# Patient Record
Sex: Female | Born: 2000 | Race: White | Hispanic: No | Marital: Single | Smoking: Never smoker
Health system: Southern US, Community
[De-identification: ages and names within clinical notes are randomized; demographics above are authoritative.]

## PROBLEM LIST (undated history)

## (undated) DIAGNOSIS — C801 Malignant (primary) neoplasm, unspecified: Secondary | ICD-10-CM

## (undated) DIAGNOSIS — R569 Unspecified convulsions: Secondary | ICD-10-CM

## (undated) DIAGNOSIS — F4320 Adjustment disorder, unspecified: Secondary | ICD-10-CM

## (undated) DIAGNOSIS — F445 Conversion disorder with seizures or convulsions: Secondary | ICD-10-CM

## (undated) DIAGNOSIS — F411 Generalized anxiety disorder: Secondary | ICD-10-CM

## (undated) HISTORY — PX: OTHER SURGICAL HISTORY: SHX169

---
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
## 2019-01-01 ENCOUNTER — Other Ambulatory Visit: Payer: Self-pay

## 2019-01-01 ENCOUNTER — Emergency Department (HOSPITAL_COMMUNITY)
Admission: EM | Admit: 2019-01-01 | Discharge: 2019-01-02 | Disposition: A | Discharge status: Home / Self Care | Payer: Medicaid Other | Attending: Emergency Medicine | Admitting: Emergency Medicine

## 2019-01-01 ENCOUNTER — Encounter (HOSPITAL_COMMUNITY): Payer: Self-pay

## 2019-01-01 DIAGNOSIS — Z79899 Other long term (current) drug therapy: Secondary | ICD-10-CM | POA: Diagnosis not present

## 2019-01-01 DIAGNOSIS — Z85841 Personal history of malignant neoplasm of brain: Secondary | ICD-10-CM | POA: Diagnosis not present

## 2019-01-01 DIAGNOSIS — R569 Unspecified convulsions: Secondary | ICD-10-CM | POA: Diagnosis present

## 2019-01-01 LAB — CBC WITH DIFFERENTIAL/PLATELET
Abs Immature Granulocytes: 0.02 10*3/uL (ref 0.00–0.07)
Basophils Absolute: 0.1 10*3/uL (ref 0.0–0.1)
Basophils Relative: 1 %
Eosinophils Absolute: 0.2 10*3/uL (ref 0.0–0.5)
Eosinophils Relative: 2 %
HCT: 46.9 % — ABNORMAL HIGH (ref 36.0–46.0)
Hemoglobin: 15.5 g/dL — ABNORMAL HIGH (ref 12.0–15.0)
Immature Granulocytes: 0 %
Lymphocytes Relative: 28 %
Lymphs Abs: 2 10*3/uL (ref 0.7–4.0)
MCH: 30.3 pg (ref 26.0–34.0)
MCHC: 33 g/dL (ref 30.0–36.0)
MCV: 91.8 fL (ref 80.0–100.0)
Monocytes Absolute: 0.7 10*3/uL (ref 0.1–1.0)
Monocytes Relative: 10 %
Neutro Abs: 4.1 10*3/uL (ref 1.7–7.7)
Neutrophils Relative %: 59 %
Platelets: 358 10*3/uL (ref 150–400)
RBC: 5.11 MIL/uL (ref 3.87–5.11)
RDW: 12.1 % (ref 11.5–15.5)
WBC: 7 10*3/uL (ref 4.0–10.5)
nRBC: 0 % (ref 0.0–0.2)

## 2019-01-01 LAB — CBG MONITORING, ED: Glucose-Capillary: 83 mg/dL (ref 70–99)

## 2019-01-01 LAB — COMPREHENSIVE METABOLIC PANEL
ALT: 16 U/L (ref 0–44)
AST: 19 U/L (ref 15–41)
Albumin: 4.3 g/dL (ref 3.5–5.0)
Alkaline Phosphatase: 74 U/L (ref 38–126)
Anion gap: 13 (ref 5–15)
BUN: 7 mg/dL (ref 6–20)
CO2: 23 mmol/L (ref 22–32)
Calcium: 9.7 mg/dL (ref 8.9–10.3)
Chloride: 102 mmol/L (ref 98–111)
Creatinine, Ser: 0.84 mg/dL (ref 0.44–1.00)
GFR calc Af Amer: >60 mL/min (ref >60)
GFR calc non Af Amer: >60 mL/min (ref >60)
Glucose, Bld: 94 mg/dL (ref 70–99)
Potassium: 3.8 mmol/L (ref 3.5–5.1)
Sodium: 138 mmol/L (ref 135–145)
Total Bilirubin: 0.5 mg/dL (ref 0.3–1.2)
Total Protein: 7.4 g/dL (ref 6.5–8.1)

## 2019-01-01 LAB — I-STAT BETA HCG BLOOD, ED (MC, WL, AP ONLY): I-stat hCG, quantitative: <5 m[IU]/mL (ref <5)

## 2019-01-01 LAB — MAGNESIUM: Magnesium: 2 mg/dL (ref 1.7–2.4)

## 2019-01-01 MED ORDER — SODIUM CHLORIDE 0.9 % IV BOLUS
500.0000 mL | Freq: Once | INTRAVENOUS | Status: AC
  Administered 2019-01-01: 21:00:00 500 mL via INTRAVENOUS

## 2019-01-01 MED ORDER — CLONAZEPAM 0.5 MG PO TABS: 0.5000 mg | ORAL_TABLET | Freq: Once | ORAL | Status: DC

## 2019-01-01 MED ORDER — CLONAZEPAM 0.5 MG PO TABS: 0.5000 mg | ORAL_TABLET | Freq: Once | ORAL | 0 refills | Status: DC

## 2019-01-01 NOTE — ED Triage Notes (Signed)
Pt arrives from home via [facility] due to multiple seizure like activity throughout the day. History of seizures and brain surgery Aug 2019.

## 2019-01-01 NOTE — ED Notes (Signed)
Pt's mother called and wanted to let the ED know that she is allergic to [facility]. She said the pt has brain cancer and the facility she came from gave her Vinpat @ 18:00. Please call Markeshia Manbeck at [phone]. She is on her way from [place].

## 2019-01-01 NOTE — ED Provider Notes (Signed)
[place] EMERGENCY DEPARTMENT Provider Note   CSN: [id number] Arrival date & time: 01/01/19  2020     History   Chief Complaint Chief Complaint  Patient presents with  . Seizures    HPI Madeline [name] is a 18 y.o. female.     The history is provided by the patient and medical records. No language interpreter was used.   Madeline [name] is a 18 y.o. female who presents to the Emergency Department complaining of seizure. She presents to the emergency department for evaluation of seizure like activity that occurred today. 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 In February of this year she had new onset of seizures and was started on vimpat. Today she was running late for class after dance class and ran to class. When she got there she had some twitching of his right shoulder, which is typical of her seizure activity. She was able to calm herself down and went home. She then had recurrent twitching of her right shoulder that became twitching of bilateral arms and feet. After about 15 to 20 minutes of this activity she had what she describes as generalized seizure activity that lasted 3 to 4 minutes. She states that this was different from her typical seizure and that she recalls the entire event. Her prior seizure she blacked out. EMS was called and she declines transferred at that time. She then had a second episode that was similar with about 15 to 20 minutes of twitching of her right shoulder and bilateral upper extremities that then became generalized seizure activity. She did take one of her Klonopin at home, 1 mg. On ED arrival she complains of feeling very fatigued with generalized weakness. She denies any recent illnesses or increased stressors. Denies fevers, cough, nausea, vomiting. She is compliant with her home medications.  Past Medical History:  Diagnosis Date  . Cancer [facility])    brain cancer     There are no active problems to display for this patient.      OB History   No obstetric history on file.      Home Medications    Prior to Admission medications   Medication Sig Start Date End Date Taking? Authorizing Provider  acetaminophen (TYLENOL) 325 MG tablet Take 325 mg by mouth every 6 (six) hours as needed. 12/20/17   [provider]  clonazePAM (KLONOPIN) 0.5 MG tablet Take 1 tablet (0.5 mg total) by mouth once for 1 dose. 01/01/19 01/01/19  Quintella Reichert, MD  docusate sodium (COLACE) 100 MG capsule Take 100 mg by mouth daily as needed.    [provider]  levETIRAcetam (KEPPRA) 500 MG tablet Take 1 tablet (500 mg total) by mouth 2 (two) times daily for 30 days. 05/01/18 05/31/18  Curatolo, Adam, DO  memantine (NAMENDA) 10 MG tablet Take 10 mg by mouth 2 (two) times daily as needed for depression. 11/06/17 12/13/18  [provider]  ondansetron (ZOFRAN) 4 MG tablet Take 4 mg by mouth 2 (two) times daily as needed. 12/21/17   [provider]  SLYND 4 MG TABS Take 4 mg by mouth daily. 03/27/18   [provider]    Family History No family history on file.  Social History Social History   Tobacco Use  . Smoking status: Not on file  Substance Use Topics  . Alcohol use: Not on file  . Drug use: Not on file  Allergies   Keppra [levetiracetam] and Penicillins   Review of Systems Review of Systems  All other systems reviewed and are negative.    Physical Exam Updated Vital Signs BP 130/87   Pulse (!) 55   Temp 98.5 F (36.9 C) (Oral)   Resp (!) 21   Ht 5\' 4"  (1.626 m)   Wt 65.8 kg   SpO2 99%   BMI 24.89 kg/m   Physical Exam Vitals signs and nursing note reviewed.  Constitutional:      Appearance: She is well-developed.  HENT:     Head: Normocephalic and atraumatic.  Cardiovascular:     Rate and Rhythm: Normal rate and regular rhythm.     Heart sounds: No murmur.  Pulmonary:     Effort: Pulmonary  effort is normal. No respiratory distress.     Breath sounds: Normal breath sounds.  Abdominal:     Palpations: Abdomen is soft.     Tenderness: There is no abdominal tenderness. There is no guarding or rebound.  Musculoskeletal:        General: No swelling or tenderness.  Skin:    General: Skin is warm and dry.  Neurological:     Mental Status: She is alert and oriented to person, place, and time.     Comments: Visual fields are grossly intact. EOMI. No asymmetry of facial movements. Mild generalized weakness.  No pronator drift.  Psychiatric:        Behavior: Behavior normal.      ED Treatments / Results  Labs (all labs ordered are listed, but only abnormal results are displayed) Labs Reviewed  CBC WITH DIFFERENTIAL/PLATELET - Abnormal; Notable for the following components:      Result Value   Hemoglobin 15.5 (*)    HCT 46.9 (*)    All other components within normal limits  COMPREHENSIVE METABOLIC PANEL  MAGNESIUM  CBG MONITORING, ED  I-STAT BETA HCG BLOOD, ED ([facility], [facility], [facility] ONLY)  I-STAT BETA HCG BLOOD, ED ([facility], [facility], [facility] ONLY)    EKG EKG Interpretation  Date/Time:  Tuesday January 01 2019 20:21:01 EDT Ventricular Rate:  86 PR Interval:    QRS Duration: 92 QT Interval:  373 QTC Calculation: 447 R Axis:   96 Text Interpretation:  Sinus rhythm Borderline right axis deviation Borderline ST elevation, lateral leads no prior available for comparison Confirmed by Quintella Reichert [phone]) on 01/01/2019 8:24:20 PM   Radiology No results found.  Procedures Procedures (including critical care time)  Medications Ordered in ED Medications  sodium chloride 0.9 % bolus 500 mL (0 mLs Intravenous Stopped 01/01/19 2256)     Initial Impression / Assessment and Plan / ED Course  I have reviewed the triage vital signs and the nursing notes.  Pertinent labs & imaging results that were available during my care of the patient were reviewed by me and considered in my medical decision  making (see chart for details).        Patient with history of ependymoma status post resection, radiation and seizure disorder here for evaluation of recurrent seizure like activity. She has mild generalized weakness on evaluation with no focal neurologic deficits. She did have clonazepam prior to ED arrival. She is not had any recurrent generalized seizure activity during her ED stay. Labs without any significant electrolyte abnormality. Based on her description of recalling the entire event it is unclear if these were true seizures. Discussed with on-call neurologist, Dr. Leonel Ramsay. He recommends .5 mg Klonopin in the morning and close  follow-up with her neurologist for medication adjustments. Discussed the plan with the patient and her mother(via phone). Plan to discharge home with outpatient follow-up and return precautions.  Final Clinical Impressions(s) / ED Diagnoses   Final diagnoses:  Seizure-like activity [facility])    ED Discharge Orders         Ordered    Ambulatory referral to Neurology    Comments: An appointment is requested in approximately: 1 week  Pt with seizure disorder, just moved to area from [place] for school- needs a local neurologist   01/01/19 2333    clonazePAM (KLONOPIN) 0.5 MG tablet   Once     01/01/19 2334           Quintella Reichert, MD 01/02/19 0023

## 2019-01-05 ENCOUNTER — Inpatient Hospital Stay (HOSPITAL_COMMUNITY)
Admission: EM | Admit: 2019-01-05 | Discharge: 2019-01-09 | DRG: 101 | Disposition: A | Discharge status: Home / Self Care | Payer: Medicaid Other | Attending: Internal Medicine | Admitting: Internal Medicine

## 2019-01-05 ENCOUNTER — Other Ambulatory Visit: Payer: Self-pay

## 2019-01-05 ENCOUNTER — Emergency Department (HOSPITAL_COMMUNITY): Payer: Medicaid Other

## 2019-01-05 DIAGNOSIS — Z923 Personal history of irradiation: Secondary | ICD-10-CM

## 2019-01-05 DIAGNOSIS — R569 Unspecified convulsions: Secondary | ICD-10-CM

## 2019-01-05 DIAGNOSIS — Z85841 Personal history of malignant neoplasm of brain: Secondary | ICD-10-CM

## 2019-01-05 DIAGNOSIS — R339 Retention of urine, unspecified: Secondary | ICD-10-CM | POA: Diagnosis present

## 2019-01-05 DIAGNOSIS — G40909 Epilepsy, unspecified, not intractable, without status epilepticus: Principal | ICD-10-CM | POA: Diagnosis present

## 2019-01-05 DIAGNOSIS — Z79899 Other long term (current) drug therapy: Secondary | ICD-10-CM

## 2019-01-05 DIAGNOSIS — Z20828 Contact with and (suspected) exposure to other viral communicable diseases: Secondary | ICD-10-CM | POA: Diagnosis present

## 2019-01-05 LAB — HEPATIC FUNCTION PANEL
ALT: 17 U/L (ref 0–44)
AST: 17 U/L (ref 15–41)
Albumin: 3.9 g/dL (ref 3.5–5.0)
Alkaline Phosphatase: 66 U/L (ref 38–126)
Bilirubin, Direct: <0.1 mg/dL (ref 0.0–0.2)
Total Bilirubin: 0.2 mg/dL — ABNORMAL LOW (ref 0.3–1.2)
Total Protein: 6.9 g/dL (ref 6.5–8.1)

## 2019-01-05 LAB — BASIC METABOLIC PANEL
Anion gap: 8 (ref 5–15)
BUN: 8 mg/dL (ref 6–20)
CO2: 26 mmol/L (ref 22–32)
Calcium: 9.4 mg/dL (ref 8.9–10.3)
Chloride: 102 mmol/L (ref 98–111)
Creatinine, Ser: 0.68 mg/dL (ref 0.44–1.00)
GFR calc Af Amer: >60 mL/min (ref >60)
GFR calc non Af Amer: >60 mL/min (ref >60)
Glucose, Bld: 94 mg/dL (ref 70–99)
Potassium: 3.8 mmol/L (ref 3.5–5.1)
Sodium: 136 mmol/L (ref 135–145)

## 2019-01-05 LAB — CBC WITH DIFFERENTIAL/PLATELET
Abs Immature Granulocytes: 0.03 10*3/uL (ref 0.00–0.07)
Basophils Absolute: 0.1 10*3/uL (ref 0.0–0.1)
Basophils Relative: 1 %
Eosinophils Absolute: 0.3 10*3/uL (ref 0.0–0.5)
Eosinophils Relative: 3 %
HCT: 44.9 % (ref 36.0–46.0)
Hemoglobin: 14.9 g/dL (ref 12.0–15.0)
Immature Granulocytes: 0 %
Lymphocytes Relative: 31 %
Lymphs Abs: 2.8 10*3/uL (ref 0.7–4.0)
MCH: 30.4 pg (ref 26.0–34.0)
MCHC: 33.2 g/dL (ref 30.0–36.0)
MCV: 91.6 fL (ref 80.0–100.0)
Monocytes Absolute: 0.6 10*3/uL (ref 0.1–1.0)
Monocytes Relative: 7 %
Neutro Abs: 5.2 10*3/uL (ref 1.7–7.7)
Neutrophils Relative %: 58 %
Platelets: 411 10*3/uL — ABNORMAL HIGH (ref 150–400)
RBC: 4.9 MIL/uL (ref 3.87–5.11)
RDW: 12.1 % (ref 11.5–15.5)
WBC: 8.9 10*3/uL (ref 4.0–10.5)
nRBC: 0 % (ref 0.0–0.2)

## 2019-01-05 LAB — I-STAT BETA HCG BLOOD, ED (MC, WL, AP ONLY): I-stat hCG, quantitative: <5 m[IU]/mL (ref <5)

## 2019-01-05 LAB — MAGNESIUM: Magnesium: 2.1 mg/dL (ref 1.7–2.4)

## 2019-01-05 LAB — CBG MONITORING, ED: Glucose-Capillary: 85 mg/dL (ref 70–99)

## 2019-01-05 MED ORDER — HALOPERIDOL LACTATE 5 MG/ML IJ SOLN: 2.0000 mg | Freq: Once | INTRAMUSCULAR | Status: DC

## 2019-01-05 MED ORDER — GADOBUTROL 1 MMOL/ML IV SOLN
6.0000 mL | Freq: Once | INTRAVENOUS | Status: AC | PRN
  Administered 2019-01-05: 6 mL via INTRAVENOUS

## 2019-01-05 MED ORDER — ACETAMINOPHEN 325 MG PO TABS
325.0000 mg | ORAL_TABLET | Freq: Once | ORAL | Status: AC
  Administered 2019-01-05: 325 mg via ORAL
  Filled 2019-01-05: qty 1

## 2019-01-05 MED ORDER — HALOPERIDOL LACTATE 5 MG/ML IJ SOLN
2.0000 mg | Freq: Once | INTRAMUSCULAR | Status: AC
  Administered 2019-01-05: 2 mg via INTRAVENOUS
  Filled 2019-01-05: qty 1

## 2019-01-05 MED ORDER — MORPHINE SULFATE (PF) 2 MG/ML IV SOLN
2.0000 mg | Freq: Once | INTRAVENOUS | Status: AC
  Administered 2019-01-05: 2 mg via INTRAVENOUS
  Filled 2019-01-05: qty 1

## 2019-01-05 MED ORDER — LORAZEPAM 2 MG/ML IJ SOLN
INTRAMUSCULAR | Status: AC
  Administered 2019-01-05: 2 mg
  Filled 2019-01-05: qty 1

## 2019-01-05 NOTE — ED Notes (Signed)
Received called from MRI that patient was actively seizing while attempting to complete scan. Informed PA, Athena Masse and Dr.  Singer of patients status. PA, Morelli placed order for Haldol. Haldol dose given to patient in MRI.

## 2019-01-05 NOTE — ED Provider Notes (Signed)
Medical screening examination/treatment/procedure(s) were conducted as a shared visit with non-physician practitioner(s) and myself.  I personally evaluated the patient during the encounter.    18yF with seizure-like activity. At ~1812 we were called to bedside by nursing. Pt on L side shaking all extremities and hyperventilating. Abated before ativan could be given. Immediately after patient opened her eyes on command and clearly tracking. 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 Recorded history of seizures developing this past February and on meds. Her neurologists plan was for MRI after her most recent event. Will go ahead an obtain that today. Ativan subsequently administered. Will continue to monitor.    Virgel Manifold, MD 01/06/19 [phone]

## 2019-01-05 NOTE — ED Provider Notes (Signed)
[place] EMERGENCY DEPARTMENT Provider Note   CSN: [id number] Arrival date & time: 01/05/19  1721     History   Chief Complaint Chief Complaint  Patient presents with  . Seizures    HPI Madeline [name] is a 18 y.o. 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  Per patient she had 3 generalized seizures today witnessed by her roommate, each lasting less than 1 minute.  Brought in by EMS.  At time of evaluation patient reports fatigue, generalized throbbing headache moderate intensity constant without aggravating or alleviating factors.  Patient seen and evaluated on 01/02/2019 following a seizure.  Since that time she has been on bridge therapy of Vimpat 300 mg daily as well as Klonopin 0.5 mg twice daily.  So far today she has had 200 mg of Vimpat and 1 Klonopin this morning. - Discussion with patient's mother who is currently in [place] via face time, per mother that they just had a tele-visit with patient's neurologist in [place], after previous ED visit they had deferred imaging at that time as patient has scheduled MRI in December for follow-up however if patient was to have recurrence of seizure the plan was for imaging/MRI.  Denies history of fever/chills, injury, neck pain, chest pain/shortness of breath, vomiting/diarrhea, dysuria, abdominal pain or numbness/weakness, tingling or any additional concerns.     HPI  Past Medical History:  Diagnosis Date  . Cancer [facility])    brain cancer    There are no active problems to display for this patient.      OB History   No obstetric history on file.      Home Medications    Prior to Admission medications   Medication Sig Start Date End Date Taking? Authorizing Provider  acetaminophen (TYLENOL) 325 MG tablet Take 325 mg by mouth every 6 (six) hours as needed. 12/20/17   [provider]  clonazePAM (KLONOPIN) 0.5  MG tablet Take 1 tablet (0.5 mg total) by mouth once for 1 dose. 01/01/19 01/05/19  Quintella Reichert, MD  docusate sodium (COLACE) 100 MG capsule Take 100 mg by mouth daily as needed.    [provider]  levETIRAcetam (KEPPRA) 500 MG tablet Take 1 tablet (500 mg total) by mouth 2 (two) times daily for 30 days. 05/01/18 01/05/19  Curatolo, Adam, DO  memantine (NAMENDA) 10 MG tablet Take 10 mg by mouth 2 (two) times daily as needed for depression. 11/06/17 01/05/19  [provider]  ondansetron (ZOFRAN) 4 MG tablet Take 4 mg by mouth 2 (two) times daily as needed. 12/21/17   [provider]  SLYND 4 MG TABS Take 4 mg by mouth daily. 03/27/18   [provider]    Family History No family history on file.  Social History Social History   Tobacco Use  . Smoking status: Not on file  Substance Use Topics  . Alcohol use: Not on file  . Drug use: Not on file     Allergies   Keppra [levetiracetam] and Penicillins   Review of Systems Review of Systems Ten systems are reviewed and are negative for acute change except as noted in the HPI   Physical Exam Updated Vital Signs BP 115/67   Pulse (!) 59   Temp 98.6 F (37 C) (Oral)   Resp 18   Ht 5\' 4"  (1.626 m)   Wt 65.8 kg   LMP 12/22/2018 (Approximate)   SpO2 97%  BMI 24.89 kg/m   Physical Exam Constitutional:      General: She is not in acute distress.    Appearance: Normal appearance. She is well-developed. She is not ill-appearing or diaphoretic.     Comments: Tired appearing  HENT:     Head: Normocephalic and atraumatic. No raccoon eyes or Battle's sign.     Jaw: There is normal jaw occlusion. No trismus.     Right Ear: External ear normal.     Left Ear: External ear normal.     Nose: Nose normal.     Mouth/Throat:     Mouth: Mucous membranes are moist.     Pharynx: Oropharynx is clear.  Eyes:     General: Vision grossly intact. Gaze aligned appropriately.     Extraocular Movements:  Extraocular movements intact.     Conjunctiva/sclera: Conjunctivae normal.     Pupils: Pupils are equal, round, and reactive to light.  Neck:     Musculoskeletal: Normal range of motion.     Trachea: Trachea and phonation normal. No tracheal deviation.  Pulmonary:     Effort: Pulmonary effort is normal. No respiratory distress.  Abdominal:     General: There is no distension.     Palpations: Abdomen is soft.     Tenderness: There is no abdominal tenderness. There is no guarding or rebound.  Musculoskeletal: Normal range of motion.  Skin:    General: Skin is warm and dry.  Neurological:     Mental Status: She is alert.     GCS: GCS eye subscore is 4. GCS verbal subscore is 5. GCS motor subscore is 6.     Comments: Speech is clear and goal oriented, follows commands Major Cranial nerves without deficit, no facial droop Normal strength in upper and lower extremities bilaterally including dorsiflexion and plantar flexion, strong and equal grip strength Sensation normal to light and sharp touch Moves extremities without ataxia, coordination intact No pronator drift  Psychiatric:        Behavior: Behavior normal.      ED Treatments / Results  Labs (all labs ordered are listed, but only abnormal results are displayed) Labs Reviewed  CBC WITH DIFFERENTIAL/PLATELET - Abnormal; Notable for the following components:      Result Value   Platelets 411 (*)    All other components within normal limits  URINALYSIS, ROUTINE W REFLEX MICROSCOPIC  HEPATIC FUNCTION PANEL  MAGNESIUM  BASIC METABOLIC PANEL  CBG MONITORING, ED  I-STAT BETA HCG BLOOD, ED ([facility], [facility], [facility] ONLY)  CBG MONITORING, ED    EKG None  Radiology No results found.  Procedures Procedures (including critical care time)  Medications Ordered in ED Medications  haloperidol lactate (HALDOL) injection 2 mg (has no administration in time range)  morphine 2 MG/ML injection 2 mg (2 mg Intravenous Given 01/05/19 1819)   acetaminophen (TYLENOL) tablet 325 mg (325 mg Oral Given 01/05/19 1949)  LORazepam (ATIVAN) 2 MG/ML injection (2 mg  Given 01/05/19 1818)     Initial Impression / Assessment and Plan / ED Course  I have reviewed the triage vital signs and the nursing notes.  Pertinent labs & imaging results that were available during my care of the patient were reviewed by me and considered in my medical decision making (see chart for details).  Clinical Course as of Jan 04 2050  Sat Jan 05, 2019  1925 Dr. Leonel Ramsay   [BM]    Clinical Course User Index [BM] Deliah Boston, PA-C  Chart review: 01/02/2019' [facility]; neurology Dr. Revonda Standard Prescriptions-Vimpat 150 mg twice daily x90 days, Klonopin 0.5 mg twice daily x5 days Plan- CLP 0.5 mg twice daily x5 days as bridge therapy, Vimpat 100 mg twice daily x3 days then increased to 150 mg twice daily Discussed potential EEG for further seizures - 6:20 PM: Called back into room as patient seizing, full body shaking lasting approximately 1 minute, follows commands just after activity ends.  Ativan 2 mg given. - 6:50 PM: Called again to patient's room for seizure-like activities.  Patient rhythmically shaking back and forth this lasted for approximately 30 seconds after arrival to the room.  Immediately after patient is following commands, tired appearing.  Vital signs stable. - 7:20 PM: Called back to patient's room for seizure-like activities.  Upon my arrival activity has ceased.  She is alert and oriented x4 continues to be tired appearing.  Consult placed to neurology. - CBG 85 Beta-hCG negative CBC nonacute - Patient seen and evaluated by neurology, Dr. 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 - 8:35 PM: Patient reevaluated she is rocking slowly back and forth in bed communicating with RN through grunts.  When I address the patient she grunts in acknowledgment and then begins rocking side-to-side more quickly. -  Patient being transported to MRI. While there patient with return of seizure-like activity, rediscussed with Dr. Wilson Singer, will give 2 mg Haldol.  I evaluated patient in MRI, she has stopped rocking back and forth, resting comfortably. - Care handoff given to Wyn Quaker, PA-C at shift change.  Plan of care is to follow-up on MRI, remaining blood work and reassess.  Disposition per oncoming team.   Patient was seen and evaluated by Dr. Wilson Singer during this visit.  Note: Portions of this report may have been transcribed using voice recognition software. Every effort was made to ensure accuracy; however, inadvertent computerized transcription errors may still be present. Final Clinical Impressions(s) / ED Diagnoses   Final diagnoses:  None    ED Discharge Orders    None       Gari Crown 01/05/19 2056    Virgel Manifold, MD 01/06/19 [phone]

## 2019-01-05 NOTE — ED Notes (Signed)
Noted patient's monitor reads HR of 240s with artifact as well; Sp02 and rest of vitals remain WNL. Upon arrival to patient's room, patient is in bed with family member; patient is shaking all over. Patient appears to understand when staff and family are speaking during episodes. Noted patient slowly normalized movements and heart rate and rhythm reflects normal as well.

## 2019-01-05 NOTE — ED Triage Notes (Signed)
Pt had witnessed seizure at home by mother.  Per medics, pts doc has changed meds around recently.  Pt c/o headache at this time

## 2019-01-05 NOTE — ED Notes (Signed)
While walking by patient's room, patients family member informed this nurse that patient was having a seizure. Upon assessment, patient was noted to be shaking and rolling her head around in bed, unable to respond to verbal commands. PA, Athena Masse and patients primary nurse, Tiffany notified. PA, Athena Masse and MD, Kohut at bedside. Patient given 2mg  of IV ativan per PA order.

## 2019-01-06 ENCOUNTER — Observation Stay (HOSPITAL_COMMUNITY): Payer: Medicaid Other

## 2019-01-06 DIAGNOSIS — R569 Unspecified convulsions: Secondary | ICD-10-CM | POA: Diagnosis not present

## 2019-01-06 LAB — URINALYSIS, ROUTINE W REFLEX MICROSCOPIC
Bilirubin Urine: NEGATIVE
Glucose, UA: NEGATIVE mg/dL
Hgb urine dipstick: NEGATIVE
Ketones, ur: NEGATIVE mg/dL
Leukocytes,Ua: NEGATIVE
Nitrite: NEGATIVE
Protein, ur: NEGATIVE mg/dL
Specific Gravity, Urine: 1.026 (ref 1.005–1.030)
pH: 6 (ref 5.0–8.0)

## 2019-01-06 LAB — BASIC METABOLIC PANEL
Anion gap: 9 (ref 5–15)
BUN: 8 mg/dL (ref 6–20)
CO2: 26 mmol/L (ref 22–32)
Calcium: 9.5 mg/dL (ref 8.9–10.3)
Chloride: 103 mmol/L (ref 98–111)
Creatinine, Ser: 0.69 mg/dL (ref 0.44–1.00)
GFR calc Af Amer: >60 mL/min (ref >60)
GFR calc non Af Amer: >60 mL/min (ref >60)
Glucose, Bld: 89 mg/dL (ref 70–99)
Potassium: 3.9 mmol/L (ref 3.5–5.1)
Sodium: 138 mmol/L (ref 135–145)

## 2019-01-06 LAB — SARS CORONAVIRUS 2 (TAT 6-24 HRS): SARS Coronavirus 2: NEGATIVE

## 2019-01-06 LAB — HIV ANTIBODY (ROUTINE TESTING W REFLEX): HIV Screen 4th Generation wRfx: NONREACTIVE

## 2019-01-06 MED ORDER — ACETAMINOPHEN 325 MG PO TABS
650.0000 mg | ORAL_TABLET | Freq: Four times a day (QID) | ORAL | Status: DC | PRN
  Administered 2019-01-06 – 2019-01-09 (×7): 650 mg via ORAL
  Filled 2019-01-06 (×6): qty 2

## 2019-01-06 MED ORDER — LACOSAMIDE 50 MG PO TABS
100.0000 mg | ORAL_TABLET | Freq: Two times a day (BID) | ORAL | Status: DC
  Administered 2019-01-06 – 2019-01-09 (×6): 100 mg via ORAL
  Filled 2019-01-06 (×6): qty 2

## 2019-01-06 MED ORDER — LORAZEPAM 2 MG/ML IJ SOLN
2.0000 mg | Freq: Four times a day (QID) | INTRAMUSCULAR | Status: DC | PRN
  Administered 2019-01-07: 2 mg via INTRAVENOUS
  Filled 2019-01-06: qty 1

## 2019-01-06 MED ORDER — HALOPERIDOL LACTATE 5 MG/ML IJ SOLN
5.0000 mg | Freq: Once | INTRAMUSCULAR | Status: AC
  Administered 2019-01-06: 5 mg via INTRAMUSCULAR
  Filled 2019-01-06: qty 1

## 2019-01-06 MED ORDER — ENOXAPARIN SODIUM 40 MG/0.4ML ~~LOC~~ SOLN
40.0000 mg | SUBCUTANEOUS | Status: DC
  Administered 2019-01-06 – 2019-01-08 (×3): 40 mg via SUBCUTANEOUS
  Filled 2019-01-06 (×3): qty 0.4

## 2019-01-06 MED ORDER — LACOSAMIDE 50 MG PO TABS
100.0000 mg | ORAL_TABLET | Freq: Two times a day (BID) | ORAL | Status: DC
  Administered 2019-01-06 (×2): 100 mg via ORAL
  Filled 2019-01-06 (×2): qty 2

## 2019-01-06 MED ORDER — LACOSAMIDE 50 MG PO TABS: 150.0000 mg | ORAL_TABLET | Freq: Two times a day (BID) | ORAL | Status: DC

## 2019-01-06 MED ORDER — ACETAMINOPHEN 650 MG RE SUPP: 650.0000 mg | Freq: Four times a day (QID) | RECTAL | Status: DC | PRN

## 2019-01-06 MED ORDER — CLONAZEPAM 0.5 MG PO TABS
0.5000 mg | ORAL_TABLET | Freq: Once | ORAL | Status: AC
  Administered 2019-01-06: 0.5 mg via ORAL
  Filled 2019-01-06: qty 1

## 2019-01-06 NOTE — Progress Notes (Addendum)
Pt was unable to urinate all day per RN day shift reported. Bladder scan was done, found urine retention 700 ml. Notified Dr. Antony Haste on-call provider. Order received for intermittent straight cath. Got 930 ml of clear amber urine from in and out cath. Specimen sent to lab for UA and microscopy. Result posted in chart.  Her vital signs remained stable, afebrile, sinus brady cardia on monitor. HR 50s-60. Her mother at bedside and agreed to call nurse whenever Pt has seizure. P appeared drowsy, responded to voice, able to follow commands, oriented x 4.  Will continue to monitor.  Kennyth Lose, RN

## 2019-01-06 NOTE — Consult Note (Signed)
Neurology Consultation Reason for Consult: Seizure like activity.  Referring Physician: Athena Masse, B  CC: Seizure-like activity  History is obtained from: Friend  HPI: Madeline [name] is a 18 y.o. female with a history of ependymoma resected last year.  She had a single seizure in February of this year and then no further seizures until Tuesday.  She states with that episode, she did not get any warning, she just blacked out and then was told that she had a seizure afterwards.    With the episodes that she is having more frequently, she has been having twitching that starts on her right arm and then spreads to her left arm progressing to generalized shaking with "no-no" side to side head shaking.  Her friend describes it as generalized flailing of all extremities.  The patient states that she is conscious during these "seizures."  She has had 6 of them today and therefore has presented to the emergency department.  She received sedation with the seizures and is currently very somnolent and does not answer a lot of questions.   ROS: A 14 point ROS was performed and is negative except as noted in the HPI.   Past Medical History:  Diagnosis Date  . Cancer [facility])    brain cancer     Family history: Unable to obtain due to altered mental status.  Social History: Unable to obtain due to altered mental status.   Exam: Current vital signs: BP (!) 100/58   Pulse (!) 46   Temp 98.6 F (37 C) (Oral)   Resp 15   Ht 5\' 4"  (1.626 m)   Wt 65.8 kg   LMP 12/22/2018 (Approximate)   SpO2 97%   BMI 24.89 kg/m  Vital signs in last 24 hours: Temp:  [98.6 F (37 C)] 98.6 F (37 C) (10/17 1733) Pulse Rate:  [46-71] 46 (10/17 2230) Resp:  [11-22] 15 (10/17 2230) BP: (100-137)/(55-83) 100/58 (10/17 2300) SpO2:  [95 %-99 %] 97 % (10/17 2300) Weight:  [65.8 kg] 65.8 kg (10/17 1737)   Physical Exam  Constitutional: Appears well-developed and well-nourished.  Psych: Affect appropriate to  situation Eyes: No scleral injection HENT: No OP obstrucion Head: Normocephalic.  Cardiovascular: Normal rate and regular rhythm.  Respiratory: Effort normal, non-labored breathing GI: Soft.  No distension. There is no tenderness.  Skin: WDI  Neuro: Mental Status: Patient is lethargic but easily arousable oriented to person, place, month, year, and situation. Patient is able to give a clear and coherent history. No signs of aphasia or neglect Cranial Nerves: II: Visual Fields are full. Pupils are equal, round, and reactive to light.   III,IV, VI: EOMI without ptosis or diploplia.  V: Facial sensation is symmetric to temperature VII: Facial movement is symmetric.  VIII: hearing is intact to voice X: Uvula elevates symmetrically XI: Shoulder shrug is symmetric. XII: tongue is midline without atrophy or fasciculations.  Motor: Tone is normal. Bulk is normal. 5/5 strength was present in all four extremities.  Sensory: Sensation is symmetric to light touch and temperature in the arms and legs. Cerebellar: FNF intact bilaterally   I have reviewed labs in epic and the results pertinent to this consultation are: BMP-unremarkable  I have reviewed the images obtained: MRI brain- 2 areas of nodular enhancement previously described  Impression: 18 year old female with recurrent episodes concerning for nonepileptic spells by description.  The fact that she does have a very real potential seizure focus, however, does give me pause and I think that further  evaluation with at least a routine EEG to assess for irritability would be needed.  If she continues to have spells, then I would favor continuous EEG to capture 1.  Recommendations: 1) Keppra 500 mg twice daily 2) EEG 3) consideration of continuous EEG if she continues to have spells.   Roland Rack, MD [facility] [phone]  If 7pm- 7am, please page neurology on call as listed in [place].

## 2019-01-06 NOTE — Progress Notes (Signed)
Pt arrived from ED via stretcher. According to ED RN, patient lethargic since haldol given after most recent seizure activity. Pt answering questions appropriately and following commands. Pt given CHG bath and assessments completed. CCMD called and telemetry applied. HR is in the 40-50s. This information given in report to this RN. Pt in NAD. Seizure pads placed on bed and suction setup at bedside. Bed placed in lowest position and call bell within reach. Will continue to monitor. Lajoyce Corners, RN

## 2019-01-06 NOTE — Progress Notes (Signed)
Seizure like activity lasting 3 minutes. MD Aroor called. By the time this RN got to the room seizure was over. Seizure started at 11:11 am. Over at 11:13. Per witness, pt was face timing her mother and her right arm began to twitch her heels dug into the bed. "Pts eyes were open." " First it starts with the rhythmic movements the the all out shaking." When this RN entered the room at 11:14, pt's HR was back in NSR in the 60's. 1 minute after the episode. No desaturation episodes per telemetry. Upon assessment, pt able to follow commands, but is still very sleepy she reports. MD at bedside. Will continue to monitor. See new orders. Awaiting Tech for continuous EEG.  Lucius Conn, RN

## 2019-01-06 NOTE — Progress Notes (Signed)
Received report from ED RN on patient coming to [facility] [facility]. Patient mother at bedside in ED and informed ED RN that she would not be able to come up with patient at this time. Lajoyce Corners, RN

## 2019-01-06 NOTE — ED Notes (Signed)
Pt heart rate in 40's when sleeping. Admitting providers aware.

## 2019-01-06 NOTE — Progress Notes (Signed)
Reason for consult: Seizure vs spell  Subjective: Was called by nurse for seizure-like activity lasting approximately 3 minutes around 11:00.  Patient's mother spent was up with next states that right arm began to twitch.  When asked whether her eyes were open she states that it first started out with eyes open but then she thinks she closes her eyes.  There was no desaturations during the event.  I arrived shortly after she had a spell, her eyes were closed but she was following commands on my exam.   ROS: negative except above  Examination  Vital signs in last 24 hours: Temp:  [97.6 F (36.4 C)-98.6 F (37 C)] 97.6 F (36.4 C) (10/18 0218) Pulse Rate:  [28-107] 54 (10/18 1149) Resp:  [8-57] 18 (10/18 1149) BP: (100-137)/(55-83) 100/68 (10/18 0941) SpO2:  [94 %-99 %] 97 % (10/18 1149) Weight:  [65.8 kg] 65.8 kg (10/17 1737)  General: lying in be CVS: pulse-normal rate and rhythm RS: breathing comfortably Extremities: normal   Neuro: MS: Alert, but closing ger eyes, following commands CN: face symmetric Motor: 5/5 strength in all 4 extremities Coordination: normal Gait: not tested  Basic Metabolic Panel: Recent Labs  Lab 01/01/19 2030 01/05/19 2143 01/06/19 0315  NA 138 136 138  K 3.8 3.8 3.9  CL 102 102 103  CO2 23 26 26   GLUCOSE 94 94 89  BUN 7 8 8   CREATININE 0.84 0.68 0.69  CALCIUM 9.7 9.4 9.5  MG 2.0 2.1  --     CBC: Recent Labs  Lab 01/01/19 2030 01/05/19 1909  WBC 7.0 8.9  NEUTROABS 4.1 5.2  HGB 15.5* 14.9  HCT 46.9* 44.9  MCV 91.8 91.6  PLT 358 411*     Coagulation Studies: No results for input(s): LABPROT, INR in the last 72 hours.  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  Patient is on Vimpat 150 mg twice daily but has  been having nausea vomiting and unable to keep her meds down.  Patient's family friend shows a video of nonrhythmic jerking of the entire body.  There is a concern for nonepileptic spells rather than seizures and patient being connected to long-term EEG.  MRI brain was obtained in the emergency room which did not show any recurrence of disease.  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

## 2019-01-06 NOTE — Progress Notes (Signed)
EEG Completed; Results Pending  

## 2019-01-06 NOTE — Procedures (Signed)
Patient Name: Madeline [name]  MRN: [id number]  Epilepsy Attending: Lora Havens  Referring Physician/Provider: Dr Roland Rack Date: 01/06/2019 Duration: 23.41 mins  Patient history: 18yo F with seizure like episodes. EEG to evaluate for seizure.   Level of alertness: awake  AEDs during EEG study: keppra  Technical aspects: This EEG study was done with scalp electrodes positioned according to the 10-20 International system of electrode placement. Electrical activity was acquired at a sampling rate of 500Hz  and reviewed with a high frequency filter of 70Hz  and a low frequency filter of 1Hz . EEG data were recorded continuously and digitally stored.   DESCRIPTION:  The posterior dominant rhythm consists of 9 Hz activity of moderate voltage (25-35 uV) seen predominantly in posterior head regions, asymmetric and reactive to eye opening and eye closing. There was intermittent 3-5hz  theta-delta slowing admixed with sharp transients seen in right parieto-occipital region.       One event was captured during photic stimulation. Patient was laying in bed with eyes closed, violent and non rhythmic side to side head jerking and moaning followed by jerking of right arm. This lasted for about 2 minutes after which she laid still with eyes closed for a minute and again briefly had side to side head movement with jerking of both arms. After the end of the event, patient continued to lay in bed with eyes closed. Concomitant eeg showed normal background rhythm and no seizure activity was seen.  ABNORMALITY - Intermittent slow, right parieto-occipital - Background asymmetry, right more than left - Seizure, non epileptic  IMPRESSION: This study showed evidence of cortical dysfunction in right parieto occipital region consistent with prior craniotomy. One event was captured as described above with no EEG change and was therefore a non epileptic event ( PNES).  No seizures or epileptiform discharges were  seen throughout the recording.  [name] [name]

## 2019-01-06 NOTE — Progress Notes (Signed)
LTM started; advised patient and family on use of portable potty or bedpan only; educated on the event button.

## 2019-01-06 NOTE — Progress Notes (Addendum)
   Subjective:  [name] was seen at bedside this morning. She was sleeping, but was aroused by verbal stimuli. She states that she has a headache for which she has taken tylenol. She also endorses not being able to find the right words to express herself in conversations. We spoke about her EEG monitoring. She voiced her agreement. All questions and concerns were addressed.   Objective:  Vital signs in last 24 hours: Vitals:   01/06/19 0218 01/06/19 0300 01/06/19 0700 01/06/19 0741  BP: 102/67   111/78  Pulse:  (!) 42 (!) 53 65  Resp: 11 16 (!) 8 14  Temp: 97.6 F (36.4 C)     TempSrc: Oral     SpO2: 97% 97% 96% 99%  Weight:      Height:       Physical Exam Vitals signs and nursing note reviewed.  Constitutional:      General: She is not in acute distress.    Appearance: She is normal weight. She is not ill-appearing or toxic-appearing.     Comments: Patient was sleeping at bedside, answered all questions appropriately. Somnolent. Does not appear in acute distress.   HENT:     Head: Normocephalic and atraumatic.  Cardiovascular:     Rate and Rhythm: Normal rate and regular rhythm.     Pulses: Normal pulses.     Heart sounds: Normal heart sounds. No murmur. No friction rub. No gallop.   Pulmonary:     Effort: Pulmonary effort is normal.     Breath sounds: Normal breath sounds. No wheezing, rhonchi or rales.  Abdominal:     General: Abdomen is flat. Bowel sounds are normal.     Tenderness: There is no abdominal tenderness. There is no guarding.  Neurological:     Mental Status: She is oriented to person, place, and time.     Assessment/Plan:  Active Problems:   Seizure-like activity (HCC)  Seizures:  Video of the seizure initially starts with bilateral shoulder and nonrhythmic head jerking that progressed to rhythmic bilateral arm clonic jerks. It is difficult to determine if this was truly a seizure or non epileptic spells. Neurology recommended an EEG with a  continuous EEG with video if she continues to have spells.   Increase Vimpat to 100 BID - HIV antibodies non reactive. - HIV4GL Save tube: In process - Continuous EEG with video ordered. We appreciate neurology's assistance.    DVT prophylaxis  Lovenox 40 mg SQ INJ QD  Dispo: Anticipated discharge pending medical course.   [name], MD 01/06/2019, 7:48 AM Pager: [phone]

## 2019-01-06 NOTE — H&P (Addendum)
Date: 01/06/2019               [name] Name:  Madeline [name] MRN: [id number]  DOB: 2001/02/17 Age / Sex: 18 y.o., female   [facility] [place] Service: Internal Medicine Teaching Service         Attending Physician: Dr. Virgel Manifold, MD    First Contact: Dr. Gilford Rile  Pager: [phone]  Second Contact: Dr. Myrtie Hawk  Pager: [phone]       After Hours (After 5p/  First Contact Pager: [phone]  weekends / holidays): Second Contact Pager: [phone]   Chief Complaint: Seizure  History of Present Illness: Madeline [name] is a 18 y.o female with a history of R posterior parietal lobe anaplastic ependymoma s/p resection and radiation in August 2019 and epilepsy who presented to [facility] ED with seizure like activity. History was obtained via Madeline [name], a family friend, and through chart review.  [name] is resting comfortably. Madeline [name] wakes up to tell us Madeline [name] has been having seizures. Madeline [name] states that Madeline [name] had one today. Madeline [name] felt "twitchy" and had double vision prior to her seizure like activity. Madeline [name] was lying in bed during Madeline seizures. Madeline [name] denies any tongue biting or incontinence (fecal or urinary). Madeline [name] has been taking her medications as prescribed. A family friend is present in her room and Madeline [name] tells [place] that Madeline [name] was seen on 10/13 for seizure like activity. Madeline [name] was prescribed clonazepam and instructed to follow-up with her outpatient neurologist. Prior to this Madeline [name] had been seizure free since February.   On 10/14 Madeline [name] followed up with her outpatient neurologist who reviewed a video her mom had recorded. Per Dr. Dorcas Carrow note, "could talk through Madeline seizure, arms and legs were flailing in all directions, but could speak through it. No twitching, but movements and caused bruising." Madeline [name] was prescribed Clonazepam 0.5 mg BID and her Vimpat was increased to 100 mg BID for 3  days then increased to 150 mg BID. Since 10/14 Madeline [name] has been unable to keep down her medications due to  nausea and vomiting. In addition, Madeline [name] endorses nonbloody diarrhea (1-2 BMs per day). Madeline [name] denies fevers, chills, abdominal pain, chest pain, changes in PO intake.  Madeline [name]'s family friend does have a video of a recent episode. It initially starts with bilateral shoulder and nonrhythmic head jerking that progressed to rhythmic bilateral arm clonic jerks.  In [facility] ED, a number of labs were drawn including a CBC, CMP, magnesium and beta hCG which were all unremarkable.  MRI of Madeline brain was obtained and did not show evidence of disease recurrence.  Madeline [name] received 1 dose of 0.5 mg of clonazepam and 2 mg of Haldol.  Meds:  No current facility-administered medications on file prior to encounter.    Current Outpatient Medications on File Prior to Encounter  Medication Sig Dispense Refill   acetaminophen (TYLENOL) 325 MG tablet Take 325 mg by mouth every 6 (six) hours as needed.     clonazePAM (KLONOPIN) 0.5 MG tablet Take 1 tablet (0.5 mg total) by mouth once for 1 dose. 1 tablet 0   docusate sodium (COLACE) 100 MG capsule Take 100 mg by mouth daily as needed.     levETIRAcetam (KEPPRA) 500 MG tablet Take 1 tablet (500 mg total) by mouth 2 (two) times daily for 30 days. 60 tablet 0   memantine (NAMENDA) 10 MG tablet Take 10 mg by mouth 2 (two) times  daily as needed for depression.     ondansetron (ZOFRAN) 4 MG tablet Take 4 mg by mouth 2 (two) times daily as needed.     SLYND 4 MG TABS Take 4 mg by mouth daily.     Allergies: Allergies as of 01/05/2019 - Review Complete 01/05/2019  Allergen Reaction Noted   Keppra [levetiracetam]  01/01/2019   Penicillins Rash 05/01/2018   Past Medical History:  Diagnosis Date   Cancer [facility])    brain cancer   Family History:  No family history on file.  Social History:  - Ship broker at [employer], studies fine arts and dance - Denies any tobacco, alcohol, or drug use  Review of Systems: Systems were reviewed and are otherwise negative unless mentioned  in Madeline HPI  Imaging:  MRI Brain: IMPRESSION: 1. Two small areas of nodular enhancement along Madeline right posterior parietal lobe resection cavity, but have been described on multiple prior outside MRIs. Mild regional T2 and FLAIR hyperintensity also, but no regional mass effect to strongly suggest disease recurrence. 2. Elsewhere normal MRI appearance of Madeline brain.  Physical Exam: Blood pressure (!) 100/58, pulse (!) 46, temperature 98.6 F (37 C), temperature source Oral, resp. rate 15, height 5\' 4"  (1.626 m), weight 65.8 kg, last menstrual period 12/22/2018, SpO2 97 %.  Physical Exam Vitals signs reviewed.  Constitutional:      General: Madeline [name] is not in acute distress.    Appearance: Normal appearance. Madeline [name] is normal weight. Madeline [name] is not ill-appearing, toxic-appearing or diaphoretic.     Comments: Tired appearing  HENT:     Head: Normocephalic and atraumatic.     Mouth/Throat:     Mouth: Mucous membranes are moist.     Comments: No tongue lacerations appreciated Eyes:     General: No scleral icterus.       Right eye: No discharge.        Left eye: No discharge.     Extraocular Movements: Extraocular movements intact.     Pupils: Pupils are equal, round, and reactive to light.  Cardiovascular:     Rate and Rhythm: Normal rate and regular rhythm.     Pulses: Normal pulses.     Heart sounds: Normal heart sounds. No murmur. No friction rub. No gallop.   Pulmonary:     Effort: Pulmonary effort is normal. No respiratory distress.     Breath sounds: Normal breath sounds. No wheezing or rales.  Abdominal:     General: Abdomen is flat. Bowel sounds are normal. There is no distension.     Tenderness: There is no abdominal tenderness. There is no guarding.  Musculoskeletal:        General: No swelling.     Right lower leg: No edema.     Left lower leg: No edema.  Skin:    General: Skin is warm.  Neurological:     General: No focal deficit present.     Mental Status: Madeline [name] is oriented  to person, place, and time.     Cranial Nerves: No cranial nerve deficit.     Sensory: No sensory deficit.     Comments: Moves all 4 extremities grossly    Assessment & Plan by Problem: Active Problems:   Seizure-like activity ([place])  In summary, Ms. 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  This is in Madeline context of having her  seizure regimen changed to clonazepam 0.5 mg BID and her Vimpat was increased to 100 mg BID for 3 days then increased to 150 mg BID after having seizure-like activity in her class on 10/13. At this time, it is unclear if this was truly a seizure or a psychogenic nonepileptic seizure.   #R posterior parietal lobe anaplastic ependymoma s/p resection & radiation  #Seizures: Video of Madeline seizure initially starts with bilateral shoulder and nonrhythmic head jerking that progressed to rhythmic bilateral arm clonic jerks. It is difficult to determine if this was truly a seizure or psychogenic nonepileptic seizure.  Neurology is following Madeline [name] and we will appreciate their recommendations. - Continue Vimpat 100 mg twice daily - HIV antibodies ordered  #DVT prophylaxis - Lovenox 40 mg subq injections daily   #Dispo: Admit [name] to Observation with expected length of stay less than 2 midnights.  #CODE STATUS: Full  Signed: Earlene Plater, MD Internal Medicine, PGY1 Pager: [phone]  01/06/2019,1:57 AM

## 2019-01-06 NOTE — ED Provider Notes (Signed)
I assumed care of patient from previous team, please see their note for full H&P. Briefly patient is status post ependymoma and resection with radiation therapy who presents today for 3 reported seizures.  Plan is to put on MRI and lab results and consult neurology again. Physical Exam  BP (!) 100/58   Pulse (!) 46   Temp 98.6 F (37 C) (Oral)   Resp 15   Ht 5\' 4"  (1.626 m)   Wt 65.8 kg   LMP 12/22/2018 (Approximate)   SpO2 97%   BMI 24.89 kg/m   Physical Exam Vitals signs and nursing note reviewed.  Constitutional:      Comments: Sleeping, in no distress  HENT:     Head: Atraumatic.  Cardiovascular:     Rate and Rhythm: Bradycardia present.     Comments: asleep Pulmonary:     Effort: Pulmonary effort is normal. No respiratory distress.     ED Course/Procedures   Clinical Course as of Jan 05 22  Sat Jan 05, 2019  1925 Dr. Leonel Ramsay   [BM]    Clinical Course User Index [BM] Gari Crown    Procedures  Mr Jeri Cos And Wo Contrast  Result Date: 01/05/2019 CLINICAL DATA:  18 year old female with history of right anaplastic ependymoma status post resection and radiation. Increasing frequency of seizures. EXAM: MRI HEAD WITHOUT AND WITH CONTRAST TECHNIQUE: Multiplanar, multiecho pulse sequences of the brain and surrounding structures were obtained without and with intravenous contrast. CONTRAST:  80mL GADAVIST GADOBUTROL 1 MMOL/ML IV SOLN COMPARISON:  Head CT without contrast 05/01/2018. Report of outside brain MRIs 11/02/2018 and earlier. FINDINGS: Brain: Previous posterior right parietal craniotomy with underlying resection cavity demonstrating mild hemosiderin and mild regional T2/FLAIR hyperintensity. Two separate small areas of nodularity along opposite ends of the cavity seen on series 20, images 39 and 38 measure 4-6 millimeters each, and are slightly conspicuous on trace DWI (series 5, image 72 furthermore medial area). However, there is no clearly  associated edema and no mass effect. No other postcontrast enhancement. No regional mass effect. Mild ex vacuo enlargement of the nearby right lateral ventricle. Trace postoperative appearing dural thickening underlying the craniotomy. Normal background cerebral volume. No restricted diffusion to suggest acute infarction. No midline shift, mass effect, ventriculomegaly, or acute intracranial hemorrhage. Cervicomedullary junction and pituitary are within normal limits. Thin slice coronal imaging of the temporal lobes. No definite hippocampal asymmetry. Other mesial temporal lobe structures also appear normal. No other chronic cerebral blood products or T2/FLAIR hyperintensity. Vascular: Major intracranial vascular flow voids are preserved. The major dural venous sinuses are enhancing and appear to be patent. Skull and upper cervical spine: Negative visible cervical spine and spinal cord. Visualized bone marrow signal is within normal limits. Sinuses/Orbits: Negative orbits. Paranasal sinuses are clear. Other: Mastoids are clear. Visible internal auditory structures appear normal. Negative visible scalp and face soft tissues aside from mild postoperative changes. IMPRESSION: 1. Two small areas of nodular enhancement along the right posterior parietal lobe resection cavity, but have been described on multiple prior outside MRIs. Mild regional T2 and FLAIR hyperintensity also, but no regional mass effect to strongly suggest disease recurrence. 2. Elsewhere normal MRI appearance of the brain. Electronically Signed   By: Genevie Ann M.D.   On: 01/05/2019 21:47    Labs Reviewed  CBC WITH DIFFERENTIAL/PLATELET - Abnormal; Notable for the following components:      Result Value   Platelets 411 (*)    All other components within  normal limits  HEPATIC FUNCTION PANEL - Abnormal; Notable for the following components:   Total Bilirubin 0.2 (*)    All other components within normal limits  SARS CORONAVIRUS 2 (TAT 6-24 HRS)   MAGNESIUM  BASIC METABOLIC PANEL  URINALYSIS, ROUTINE W REFLEX MICROSCOPIC  CBG MONITORING, ED  I-STAT BETA HCG BLOOD, ED ([facility], [facility], [facility] ONLY)  CBG MONITORING, ED     MDM   I assumed care of patient from previous team, plan is to follow-up on MRI and labs.  MRI resulted.  I spoke with neurology Dr. Leonel Ramsay who recommends that patient be admitted by medicine.  I spoke with internal medicine who will admit patient.       Lorin Glass, PA-C 01/06/19 0032    Tegeler, Gwenyth Allegra, MD 01/06/19 [phone]

## 2019-01-06 NOTE — ED Notes (Signed)
Admitting at bedside 

## 2019-01-07 DIAGNOSIS — G40909 Epilepsy, unspecified, not intractable, without status epilepticus: Secondary | ICD-10-CM | POA: Diagnosis present

## 2019-01-07 DIAGNOSIS — Z923 Personal history of irradiation: Secondary | ICD-10-CM | POA: Diagnosis not present

## 2019-01-07 DIAGNOSIS — Z79899 Other long term (current) drug therapy: Secondary | ICD-10-CM | POA: Diagnosis not present

## 2019-01-07 DIAGNOSIS — Z85841 Personal history of malignant neoplasm of brain: Secondary | ICD-10-CM | POA: Diagnosis not present

## 2019-01-07 DIAGNOSIS — Z880 Allergy status to penicillin: Secondary | ICD-10-CM | POA: Diagnosis not present

## 2019-01-07 DIAGNOSIS — R339 Retention of urine, unspecified: Secondary | ICD-10-CM | POA: Diagnosis present

## 2019-01-07 DIAGNOSIS — R569 Unspecified convulsions: Secondary | ICD-10-CM | POA: Diagnosis present

## 2019-01-07 DIAGNOSIS — Z20828 Contact with and (suspected) exposure to other viral communicable diseases: Secondary | ICD-10-CM | POA: Diagnosis present

## 2019-01-07 DIAGNOSIS — Z886 Allergy status to analgesic agent status: Secondary | ICD-10-CM | POA: Diagnosis not present

## 2019-01-07 LAB — BASIC METABOLIC PANEL
Anion gap: 12 (ref 5–15)
BUN: 9 mg/dL (ref 6–20)
CO2: 25 mmol/L (ref 22–32)
Calcium: 9.5 mg/dL (ref 8.9–10.3)
Chloride: 102 mmol/L (ref 98–111)
Creatinine, Ser: 0.78 mg/dL (ref 0.44–1.00)
GFR calc Af Amer: >60 mL/min (ref >60)
GFR calc non Af Amer: >60 mL/min (ref >60)
Glucose, Bld: 92 mg/dL (ref 70–99)
Potassium: 4.1 mmol/L (ref 3.5–5.1)
Sodium: 139 mmol/L (ref 135–145)

## 2019-01-07 MED ORDER — CYCLOBENZAPRINE HCL 10 MG PO TABS
5.0000 mg | ORAL_TABLET | Freq: Every day | ORAL | Status: DC
  Administered 2019-01-07 – 2019-01-08 (×2): 5 mg via ORAL
  Filled 2019-01-07 (×2): qty 1

## 2019-01-07 NOTE — Procedures (Signed)
Electroencephalogram report- LTM  Ordering Physician : Dr. Cheral Marker  Beginning date or time: 01/06/2019 1456 hrs. Ending date or time: 01/07/2019 11:13 hours  Day of study: day 1  Medications include: Per EMR  MENTAL STATUS (per technician's notes): Alert, oriented  HISTORY: This 24 hours of intensive EEG monitoring with simultaneous video monitoring was performed for this patient with seizure-like activity. This EEG was requested to characterize the episodes, to guide medical management.  TECHNICAL DESCRIPTION:  The study consists of a continuous 16-channel multi-montage digital video EEG recording with twenty-one electrodes placed according to the International 10-20 System. Additional leads included eye leads, true temporal leads (T1, T2), and an EKG lead. Activation procedures were not done due to mental status.  REPORT: The background activity in this tracing consisted of 8 to 9 Hz posterior dominant rhythm.  There was mild hemispheric asymmetry with higher amplitudes and theta, delta activity seen in the right posterior region, likely due to breach rhythm and underlying structural abnormality in that region.  Drowsiness and sleep was manifested by background fragmentation, vertex waves, sleep spindles and K complexes. Intermittently (1749, 1820, 1013, 1027, 1109 hrs.), there were pear-shaped pushbutton events.  On the camera consisted of irregular, varying frequency and amplitude shaking of the right upper extremity, shoulder that progressed to side-to-side head shaking, large body movements including pelvic thrusting.  There was no stereotypy to these episodes.  The episodes will start and stop for more than 10 to 15 minutes. Electrographically, there was movement and electrode artifact associated with the events.  There was posterior dominant awake background seen before and in between the movement artifact.  There was no postictal slowing or change.  No epileptiform activity was seen  surrounding the events. There was no clear paroxysmal or epileptiform activity seen interictally during the recording.   IMPRESSION: This is an abnormal EEG due to: Focal slowing in the right parieto-occipital region  CLINICAL CORRELATION: Based on this EEG recording, the episodes recorded were nonepileptic in nature.  There was focal neuronal dysfunction in the right parieto-occipital region, consistent with craniotomy and surgery in that region.  Clinical correlation is required.

## 2019-01-07 NOTE — Progress Notes (Signed)
Pt's mother at bedside and reports 4 episodes of seizure-like activity in last 45 minutes. Pt's mother states that pt is acting "different" than she did with other episodes. This RN notified primary MD and neurology, per mother's request. Mother is also requesting that pt be transferred to [facility], where pt's oncology doctors are located. MDs are aware and are at bedside to speak with pt and her mother.

## 2019-01-07 NOTE — Progress Notes (Signed)
Called into room by family member because pt reported that she couldn't breathe. Patient sitting up in bed with mouth open. Rigidity present in muscles of neck, face and arms. Pt alert. Suctioned patient mouth and place pt on 1L oxygen by nasal cannula for comfort. Sats 98%. Respirations assessed at 18. Lung sounds clear. Pt vomited 150cc. Had eaten dinner and dessert tonight. Pt calmer after emesis. Will continue to monitor. Lajoyce Corners, RN

## 2019-01-07 NOTE — Progress Notes (Signed)
Notified Dr. Antony Haste, on-call provider that HR was in the low 40s with sustained sinus brady cardia on monitor. BP 90s/50s mmHg. Pt has been drowsy, but no episode of seizure noted. No new order received at this time.Will continue to monitor.  Kennyth Lose, RN

## 2019-01-07 NOTE — Progress Notes (Signed)
NEUROLOGY PROGRESS NOTE  Subjective: Patient currently very drowsy.  Easily awakens.  Mother states that there were 2 seizure-like events last night, with push-button activation by mother.  Exam: Vitals:   01/07/19 0500 01/07/19 0600  BP: (!) 91/52 99/77  Pulse: (!) 45 (!) 56  Resp: 18 15  Temp:    SpO2: 97% 97%    Physical Exam   HEENT-  Normocephalic, no lesions, without obvious abnormality.  Normal external eye and conjunctiva.   Extremities- Warm, dry and intact Musculoskeletal-no joint tenderness, deformity or swelling Skin-warm and dry, no hyperpigmentation, vitiligo, or suspicious lesions    Neuro:  Mental Status: Alert, oriented, thought content appropriate.  Speech fluent without evidence of aphasia.  Able to follow 3 step commands without difficulty. Cranial Nerves: II:  Visual fields grossly normal,  III,IV, VI: ptosis not present, extra-ocular motions intact bilaterally pupils equal, round, reactive to light and accommodation V,VII: smile symmetric, facial light touch sensation normal bilaterally Motor: Moving all extremities antigravity with no difficulty Sensory: Pinprick and light touch intact throughout, bilaterally    Medications:  Scheduled: . enoxaparin (LOVENOX) injection  40 mg Subcutaneous Q24H  . lacosamide  100 mg Oral BID    Pertinent Labs/Diagnostics:   Mr Jeri Cos And Wo Contrast Result Date: 01/05/2019  IMPRESSION: 1. Two small areas of nodular enhancement along the right posterior parietal lobe resection cavity, but have been described on multiple prior outside MRIs. Mild regional T2 and FLAIR hyperintensity also, but no regional mass effect to strongly suggest disease recurrence. 2. Elsewhere normal MRI appearance of the brain. Electronically Signed   By: Genevie Ann M.D.   On: 01/05/2019 21:47   [id number] study showed evidence of cortical dysfunction in right parieto occipital region consistent with prior craniotomy. 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  No seizures or epileptiform discharges were seen throughout the recording.  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 She presented to the ED with multiple spells concerning for seizures versus nonepileptic events. As noted in prior note:Patient's family friend shows a video of nonrhythmic jerking of the entire body.  There was a concern for nonepileptic spells rather than seizures based on review of the video by Neurology at the time of the initial consult.  1. LTM EEG showed an event without electrographic correlate, most consistent with a pseudoseizure.  2. The patient has a diagnosis of epilepsy. She has a structural lesion concerning for epileptogenic focus and should be continued on her anticonvulsant regimen despite her nonepileptic event captured on EEG this admission.  3. MRI brain was obtained in the emergency room which did not show any recurrence of disease. Two small areas of nodular enhancement along the right posterior parietal lobe resection cavity were noted, but have been described on multiple prior outside MRIs. Mild regional T2 and FLAIR hyperintensity also, but no regional mass effect to strongly suggest disease recurrence.    Recommendations: -- Continue Vimpat at current dosing of 100 mg BID -- Discontinue LTM EEG -- Patient requesting transfer to [facility] for Neurooncology evaluation. -- No further recommendations from Neurohospitalist service at this time. We will sign off. Please call if there are additional questions.    Electronically signed: Dr. Kerney Elbe 01/07/2019, 10:06 AM

## 2019-01-07 NOTE — Progress Notes (Signed)
Called to see pt at the bedside this afternoon. Pt's mother concerned about pt's continued seizure-like movements this morning and afternoon. She requests transfer to [facility] to see the pt's neuro oncologist, Dr. Kerrie Buffalo and Dr. Henderson Baltimore. The mother has been speaking with Dr. Murtis Sink nurse, Estill Bamberg, who stated that [facility] would be willing to accept the pt's transfer.   The internal medicine team had previously called the [facility] transfer center line and provided the history for the pt's hospitalization thus far including the results of the brain MRI, 24hr EEG, and neurology's assessment of these episodes as non-epileptic events. [facility]'s accepting hospitalist and medical oncologist both returned the call, after having spoken with Dr. Hali Marry, and declined the transfer request at this time.  When explaining this to the pt's mother, she is very frustrated that the transfer center declined the request when someone else she spoke to a [facility] stated they would accept the patient. She is worried about discharging the patient and driving her to [facility]. She states that [facility] or someone from Dr. Murtis Sink office will be reaching out to our service to further discuss the patient.

## 2019-01-07 NOTE — Progress Notes (Signed)
   Subjective: Patient was seen and evaluated at bedside on morning rounds. She is lethargic resting in bed. Her mother is in the room and speaks for her. States pt had 2 brief episodes of seizure like activity overnight. Mother also concerned about stomach ulcer due to ibuprofen pt was taking for headaches. Pt does endorse headache on both temples and back on neck.  Objective:  Vital signs in last 24 hours: Vitals:   01/07/19 0300 01/07/19 0400 01/07/19 0410 01/07/19 0500  BP:   (!) 94/56 (!) 91/52  Pulse: (!) 56 (!) 46 (!) 46 (!) 45  Resp: 15 16 16 18   Temp:   97.8 F (36.6 C)   TempSrc:   Oral   SpO2: 98% 98% 99% 97%  Weight:      Height:        Ph/E: Physical Exam  Constitutional: Well-developed and well-nourished. Resting in bed in no acute distress.  Head: EEG sensors in place Cardiovascular:  RRR, no murmur Respiratory: Effort normal and breath sounds normal. No wheezes, no crackles Abdomen: Positive bowel sounds, flat, non-distended; no tenderness, guarding, or rebound Neurological: Somnolence but easy to arouse and answers questions appropriately and follows commands   Assessment/Plan:  Principal Problem:   Seizure-like activity (HCC)   Seizures: Continuous EEG with video for 24 hour yesterday.  5 episodes over the course of the video described as "irregular, varying frequency and amplitude shaking of the right upper extremity, shoulder that progressed to side-to-side head shaking, large body movements including pelvic thrusting." Based on EEG, these episodes were nonepileptic. Focal slowing in the R parieto-occipital region was consistent with prior craniotomy and surgery. - Continue Vimpat 100mg  BID - HIV antibodies non reactive - HIV4GL Save tube: In process - MRI brain 10/17 with two small areas of nodular enhancement along R posterior-parietal lobe resection cavity which were seen on multiple prior outside MRIs, elsewhere normal appearance of brain  Mother  requesting transfer to [facility], based on recommendation of the nurse, Estill Bamberg, working with the pt's neuro-oncologist, Dr. Kerrie Buffalo and Henderson Baltimore. Discussed with the mother, that we can call for an inpatient transfer but an accepting physician at [facility] needs to approve the transfer. Otherwise, the mother would need to drive the patient in her private car. Pt's mother was understanding and willing to do either option. - will call [facility] transfer center   Urinary retention Pt unable to urinate yesterday. Bladder scan at 2100 with 752mL. And straight cath'd for 981mL. UA unremarkable. Pt endorses suprapubic pain this morning. Pt did receive two doses of haloperidol over the last two days, which can cause urinary retention. - bladder scan today   DVT prophylaxis -Lovenox 40 mg subQ daily  Fluids - none CODE STATUS - FULL CODE   Dispo: Anticipated discharge in approximately 0-1 day(s).     Ladona Horns, MD 01/07/2019, 6:41 AM Pager: [phone]

## 2019-01-07 NOTE — Progress Notes (Signed)
EEG LTM Complete. No skin breakdown 

## 2019-01-07 NOTE — Progress Notes (Signed)
Bladder scanned per protocol 6-8  hours later after in and out cath at 8:30 pm, detected urine 227 ml. Pt had no intention to void at this time.  No seizure episode from RN observed and from Pt's mother reported tonight. She had been drowsy all night, but able to answered questions appropriately with her eyes closed, followed commands, oriented x 4, moderate motor power/strangeth all four extremities.   Her EKG was sinus brady cardia, HR unsustained around 40s, mostly 50s-60s, BP 94/56- 110/65 mmHg, SPO2 98-99% at room air.  Will continue to monitor.  Kennyth Lose, RN

## 2019-01-07 NOTE — Progress Notes (Signed)
LTM EEG continues, impedances checked; all < 5 OHM. Skin checked at Fp1, Fp2, no skin breakdown noted.

## 2019-01-08 DIAGNOSIS — R339 Retention of urine, unspecified: Secondary | ICD-10-CM

## 2019-01-08 LAB — BASIC METABOLIC PANEL
Anion gap: 12 (ref 5–15)
BUN: 7 mg/dL (ref 6–20)
CO2: 26 mmol/L (ref 22–32)
Calcium: 9.7 mg/dL (ref 8.9–10.3)
Chloride: 100 mmol/L (ref 98–111)
Creatinine, Ser: 0.62 mg/dL (ref 0.44–1.00)
GFR calc Af Amer: >60 mL/min (ref >60)
GFR calc non Af Amer: >60 mL/min (ref >60)
Glucose, Bld: 94 mg/dL (ref 70–99)
Potassium: 3.8 mmol/L (ref 3.5–5.1)
Sodium: 138 mmol/L (ref 135–145)

## 2019-01-08 MED ORDER — CYCLOBENZAPRINE HCL 10 MG PO TABS
5.0000 mg | ORAL_TABLET | Freq: Once | ORAL | Status: AC
  Administered 2019-01-08: 5 mg via ORAL
  Filled 2019-01-08: qty 1

## 2019-01-08 NOTE — Progress Notes (Signed)
Pt given 2 mg Ativan d/t constant seizure-like activity. Pt resting comfortably. Will continue to register. Lajoyce Corners, RN

## 2019-01-08 NOTE — Progress Notes (Signed)
Pt still resting comfortably. Patient has urinated twice during the evening shift according to her mother. No reported signs of seizure-like activity since ativan given. Will continue to monitor. Lajoyce Corners, RN

## 2019-01-08 NOTE — Progress Notes (Addendum)
   Subjective:  Madeline [name] and her mother were seen at bedside this morning. Mrs. Shirar states that her daughter did have another event last night where her daughter did bite the inside of her mouth. She states that her daughter also had two episodes of urination last night. All questions and concerns were addressed.   Objective:  Vital signs in last 24 hours: Vitals:   01/08/19 0324 01/08/19 0400 01/08/19 0813 01/08/19 1214  BP:  109/70 115/72 104/66  Pulse:  63 80 83  Resp:  17 11 16   Temp: (!) 97.4 F (36.3 C)  98 F (36.7 C) 98.7 F (37.1 C)  TempSrc: Oral  Oral Oral  SpO2:  97% 100%   Weight:      Height:        Physical Exam Constitutional:      General: She is not in acute distress.    Appearance: Normal appearance.     Comments: Somnolent but arouses to voice, cooperative.   HENT:     Head: Normocephalic and atraumatic.  Cardiovascular:     Rate and Rhythm: Normal rate and regular rhythm.     Pulses: Normal pulses.     Heart sounds: Normal heart sounds. No murmur. No friction rub. No gallop.   Pulmonary:     Effort: Pulmonary effort is normal.     Breath sounds: Normal breath sounds. No wheezing, rhonchi or rales.  Abdominal:     General: Abdomen is flat. Bowel sounds are normal.     Tenderness: There is no abdominal tenderness. There is no guarding.  Musculoskeletal:     Right lower leg: No edema.     Left lower leg: No edema.  Neurological:     Mental Status: She is alert and oriented to person, place, and time.    EEG Overnight with Video: IMPRESSION: This is an abnormal EEG due to: Focal slowing in the right parieto-occipital region  CLINICAL CORRELATION: Based on this EEG recording, the episodes recorded were nonepileptic in nature.  There was focal neuronal dysfunction in the right parieto-occipital region, consistent with craniotomy and surgery in that region.  Clinical correlation is required. Assessment/Plan:  Principal Problem:   Seizure-like  activity (HCC)   Seizures: MRI brain 10/17 with two small areas of nodular enhancement along R posterior-parietal lobe resection cavity which were seen on multiple prior outside MRIs, elsewhere normal appearance of brainContinuous EEG with video for 24 hour 01/07/2019.  5 episodes over the course of the video described as "irregular, varying frequency and amplitude shaking of the right upper extremity, shoulder that progressed to side-to-side head shaking, large body movements including pelvic thrusting." Based on EEG, these episodes were nonepileptic. Focal slowing in the R parieto-occipital region was consistent with prior craniotomy and surgery. - Continue Vimpat 100mg  BID - HIV4GL Save tube: In process - Neurology has signed off, we appreciate their assistance.  - Ativan 2mg  IV, Q6H PRN. - Flexeril 5mg  tab Bedtime.   Urinary retention Pt unable to urinate yesterday. Bladder scan at 2100 with 739mL. And straight cath'd for 974mL. UA unremarkable. Pt endorses suprapubic pain 01/07/2019. Pt did receive two doses of haloperidol over the last two days, which can cause urinary retention. Patient urinated 2x overnight on 01/07/2019.  - Patient urinated 2x overnight  DVT prophylaxis -Lovenox 40 mg subQ daily  Fluids - none CODE STATUS - FULL CODE   Dispo: Anticipated discharge in approximately 0-1 day(s).   [name], MD 01/08/2019, 1:59 PM Pager: [phone]

## 2019-01-08 NOTE — Progress Notes (Signed)
[facility] reached out about transferring  [name] I spoke with the transfer coordinator and Neurology at [phone]. Patient has been placed on the waiting list, and [facility] will reach out daily. Once a bed is open, Insurance account manager will be notified.  [name], MD IMTS, PGY-1 Pager: [phone] 01/08/2019,4:28 PM

## 2019-01-09 ENCOUNTER — Other Ambulatory Visit: Payer: Self-pay | Admitting: Internal Medicine

## 2019-01-09 DIAGNOSIS — Z888 Allergy status to other drugs, medicaments and biological substances status: Secondary | ICD-10-CM

## 2019-01-09 DIAGNOSIS — Z880 Allergy status to penicillin: Secondary | ICD-10-CM

## 2019-01-09 DIAGNOSIS — Z886 Allergy status to analgesic agent status: Secondary | ICD-10-CM

## 2019-01-09 LAB — BASIC METABOLIC PANEL
Anion gap: 9 (ref 5–15)
BUN: 8 mg/dL (ref 6–20)
CO2: 26 mmol/L (ref 22–32)
Calcium: 9.6 mg/dL (ref 8.9–10.3)
Chloride: 104 mmol/L (ref 98–111)
Creatinine, Ser: 0.68 mg/dL (ref 0.44–1.00)
GFR calc Af Amer: >60 mL/min (ref >60)
GFR calc non Af Amer: >60 mL/min (ref >60)
Glucose, Bld: 96 mg/dL (ref 70–99)
Potassium: 4.3 mmol/L (ref 3.5–5.1)
Sodium: 139 mmol/L (ref 135–145)

## 2019-01-09 MED ORDER — LACOSAMIDE 100 MG PO TABS: 100.0000 mg | ORAL_TABLET | Freq: Two times a day (BID) | ORAL | 0 refills | Status: DC

## 2019-01-09 MED ORDER — CYCLOBENZAPRINE HCL 5 MG PO TABS: 5.0000 mg | ORAL_TABLET | Freq: Three times a day (TID) | ORAL | 0 refills | Status: DC | PRN

## 2019-01-09 NOTE — Discharge Summary (Signed)
Name: Tishawn Diggs MRN: [id number] DOB: [birth year] 18 y.o. PCP: [facility]  Date of Admission: 01/05/2019  5:21 PM Date of Discharge: 01/09/2019 Attending Physician: Aldine Contes, MD  Discharge Diagnosis: 1. Seizure  2. Urinary Retention  Discharge Medications: Allergies as of 01/09/2019      Reactions   Ibuprofen Other (See Comments)   Patient was weaned off this by MD   Keppra [levetiracetam] Other (See Comments)   Patient cannot function if taking this, per mother ("drops her I.Q.")   Penicillins Rash   Did it involve swelling of the face/tongue/throat, SOB, or low BP? Yes Did it involve sudden or severe rash/hives, skin peeling, or any reaction on the inside of your mouth or nose? Yes Did you need to seek medical attention at a hospital or doctor's office? Yes When did it last happen? "She was 24 years old," per mother If all above answers are "NO", may proceed with cephalosporin use.      Medication List    TAKE these medications   acetaminophen 325 MG tablet Commonly known as: TYLENOL Take 325-650 mg by mouth every 6 (six) hours as needed for mild pain or headache.   clonazePAM 0.5 MG tablet Commonly known as: KLONOPIN Take 1 tablet (0.5 mg total) by mouth once for 1 dose. What changed:   when to take this  additional instructions   cyclobenzaprine 5 MG tablet Commonly known as: FLEXERIL Take 1 tablet (5 mg total) by mouth 3 (three) times daily as needed for muscle spasms.   docusate sodium 100 MG capsule Commonly known as: COLACE Take 100 mg by mouth daily as needed for mild constipation.   DULoxetine HCl 40 MG Cpep Take 40 mg by mouth daily.   Lacosamide 100 MG Tabs Take 1 tablet (100 mg total) by mouth 2 (two) times daily. What changed:   medication strength  how much to take  when to take this  additional instructions   magnesium oxide 400 (241.3 Mg) MG tablet Commonly known as: MAG-OX Take 400 mg by mouth 2  (two) times daily.   memantine 10 MG tablet Commonly known as: NAMENDA Take 10 mg by mouth 2 (two) times daily.   ondansetron 4 MG tablet Commonly known as: ZOFRAN Take 4 mg by mouth 2 (two) times daily as needed for nausea or vomiting.   Riboflavin 100 MG Caps Take 100 mg by mouth 2 (two) times daily.   Slynd 4 MG Tabs Generic drug: Drospirenone Take 4 mg by mouth daily.       Disposition and follow-up:   Ms.Azarah Szostek was discharged from [facility] in Stable condition.  At the hospital follow up visit please address:  1.  Seizure medication optimization.   2.  Labs / imaging needed at time of follow-up: N/A  3.  Pending labs/ test needing follow-up: N/A  Follow-up Appointments: Patient states they will be driving directly to [facility] ED for reevaluation after discharge for reevaluation.   Hospital Course by problem list: 1. Seizures: Patient was admitted for multiple bouts of seizures. Neurology was consulted for workup. MRI brain 10/17 with two small areas of nodular enhancement along R posterior-parietal lobe resection cavity which were seen on multiple prior outside MRIs, elsewhere normal appearance of brain. Continuous EEG with video for 24 hour 01/07/2019 show5 episodes over the course of the video described as "irregular, varying frequency and amplitude shaking of the right upper extremity, shoulder that progressed to side-to-side head shaking, large  body movements including pelvic thrusting." Based on EEG, these episodes were nonepileptic. Focal slowing in the R parieto-occipital region was consistent with prior craniotomy and surgery. Patient was discharged with the agreement that they would go to [facility] ED for reevaluation.   2. Urinary Retention: Pt unable to urinate 01/07/2019. Bladder scan at 2100 with 728mL. And straight cath'd for 99mL. UA unremarkable. Pt endorses suprapubic pain 01/07/2019. Pt did receive two doses of haloperidol over the last  two days, which can cause urinary retention. Patient urinated 2x overnight on 01/07/2019.    Discharge Vitals:   BP (!) 109/57 (BP Location: Right Arm)   Pulse 61   Temp (!) 97.5 F (36.4 C) (Oral)   Resp 15   Ht 5\' 4"  (1.626 m)   Wt 65.8 kg   LMP 12/22/2018 (Approximate)   SpO2 100%   BMI 24.89 kg/m   Pertinent Labs, Studies, and Procedures:  EXAM: MRI HEAD WITHOUT AND WITH CONTRAST  TECHNIQUE: Multiplanar, multiecho pulse sequences of the brain and surrounding structures were obtained without and with intravenous contrast.  CONTRAST:  19mL GADAVIST GADOBUTROL 1 MMOL/ML IV SOLN  COMPARISON:  Head CT without contrast 05/01/2018.  Report of outside brain MRIs 11/02/2018 and earlier.  FINDINGS: Brain: Previous posterior right parietal craniotomy with underlying resection cavity demonstrating mild hemosiderin and mild regional T2/FLAIR hyperintensity. Two separate small areas of nodularity along opposite ends of the cavity seen on series 20, images 39 and 38 measure 4-6 millimeters each, and are slightly conspicuous on trace DWI (series 5, image 72 furthermore medial area). However, there is no clearly associated edema and no mass effect.  No other postcontrast enhancement. No regional mass effect. Mild ex vacuo enlargement of the nearby right lateral ventricle. Trace postoperative appearing dural thickening underlying the craniotomy.  Normal background cerebral volume. No restricted diffusion to suggest acute infarction. No midline shift, mass effect, ventriculomegaly, or acute intracranial hemorrhage. Cervicomedullary junction and pituitary are within normal limits.  Thin slice coronal imaging of the temporal lobes. No definite hippocampal asymmetry. Other mesial temporal lobe structures also appear normal. No other chronic cerebral blood products or T2/FLAIR hyperintensity.  Vascular: Major intracranial vascular flow voids are preserved. The major dural  venous sinuses are enhancing and appear to be patent.  Skull and upper cervical spine: Negative visible cervical spine and spinal cord. Visualized bone marrow signal is within normal limits.  Sinuses/Orbits: Negative orbits. Paranasal sinuses are clear.  Other: Mastoids are clear. Visible internal auditory structures appear normal. Negative visible scalp and face soft tissues aside from mild postoperative changes.  IMPRESSION: 1. Two small areas of nodular enhancement along the right posterior parietal lobe resection cavity, but have been described on multiple prior outside MRIs. Mild regional T2 and FLAIR hyperintensity also, but no regional mass effect to strongly suggest disease recurrence. 2. Elsewhere normal MRI appearance of the brain.  Overnight EEG with Video:  Day of study: day 1 Beginning date or time: 01/06/2019 1456 hrs. Ending date or time: 01/07/2019 11:13 hours  Medications include: Per EMR  MENTAL STATUS (per technician's notes): Alert, oriented  HISTORY: This 24 hours of intensive EEG monitoring with simultaneous video monitoring was performed for this patient with seizure-like activity. This EEG was requested to characterize the episodes, to guide medical management.  TECHNICAL DESCRIPTION:  The study consists of a continuous 16-channel multi-montage digital video EEG recording with twenty-one electrodes placed according to the International 10-20 System. Additional leads included eye leads, true temporal leads (T1, T2), and  an EKG lead. Activation procedures were not done due to mental status.  REPORT: The background activity in this tracing consisted of 8 to 9 Hz posterior dominant rhythm.  There was mild hemispheric asymmetry with higher amplitudes and theta, delta activity seen in the right posterior region, likely due to breach rhythm and underlying structural abnormality in that region.  Drowsiness and sleep was manifested by background fragmentation,  vertex waves, sleep spindles and K complexes. Intermittently (1749, 1820, 1013, 1027, 1109 hrs.), there were pear-shaped pushbutton events.  On the camera consisted of irregular, varying frequency and amplitude shaking of the right upper extremity, shoulder that progressed to side-to-side head shaking, large body movements including pelvic thrusting.  There was no stereotypy to these episodes.  The episodes will start and stop for more than 10 to 15 minutes. Electrographically, there was movement and electrode artifact associated with the events.  There was posterior dominant awake background seen before and in between the movement artifact.  There was no postictal slowing or change.  No epileptiform activity was seen surrounding the events. There was no clear paroxysmal or epileptiform activity seen interictally during the recording.  IMPRESSION: This is an abnormal EEG due to: Focal slowing in the right parieto-occipital region  CLINICAL CORRELATION: Based on this EEG recording, the episodes recorded were nonepileptic in nature.  There was focal neuronal dysfunction in the right parieto-occipital region, consistent with craniotomy and surgery in that region.  Clinical correlation is required.  Discharge Instructions: Discharge Instructions    Diet - low sodium heart healthy   Complete by: As directed    Increase activity slowly   Complete by: As directed       Signed:  [name], MD 01/09/2019, 11:39 AM   Pager: [phone]

## 2019-01-09 NOTE — Progress Notes (Addendum)
   Subjective:  Ms. Aebersold and her mother were seen at bedside this morning. We spoke with the patient about her wanting to leave AMA overnight. She states that she does better at home, but her mother stated it would be safer to be seen in the morning before discussing discharge. We discussed possible discharge. Mrs. Slonecker states that she will be bringing her daughter directly to the [facility] ED after being discharged. All questions and concerns were addressed.   Objective:  Vital signs in last 24 hours: Vitals:   01/08/19 1752 01/08/19 1945 01/09/19 0405 01/09/19 0910  BP: 118/75 111/63 107/72 (!) 109/57  Pulse: 61 72 76 61  Resp:  (!) 22 18 15   Temp: (!) 97.5 F (36.4 C) 98.3 F (36.8 C) (!) 97.5 F (36.4 C)   TempSrc: Oral Oral Oral   SpO2: 100% 99% 99% 100%  Weight:      Height:       Physical Exam Constitutional:      Appearance: Normal appearance.  HENT:     Head: Normocephalic and atraumatic.  Cardiovascular:     Rate and Rhythm: Normal rate and regular rhythm.     Pulses: Normal pulses.     Heart sounds: Normal heart sounds. No murmur. No friction rub. No gallop.   Pulmonary:     Effort: Pulmonary effort is normal.     Breath sounds: Normal breath sounds. No wheezing, rhonchi or rales.  Abdominal:     General: Abdomen is flat. Bowel sounds are normal.     Palpations: Abdomen is soft.     Tenderness: There is no abdominal tenderness. There is no guarding.  Musculoskeletal:        General: No deformity.     Right lower leg: No edema.     Left lower leg: No edema.  Skin:    General: Skin is warm.  Neurological:     Mental Status: She is alert and oriented to person, place, and time.  Psychiatric:        Mood and Affect: Mood normal.        Behavior: Behavior normal.      Assessment/Plan:  Principal Problem:   Seizure-like activity (HCC)  Seizures: MRI brain 10/17 with two small areas of nodular enhancement along R posterior-parietal lobe resection cavity  which were seen on multiple prior outside MRIs, elsewhere normal appearance of brainContinuous EEG with video for 24 hour 01/07/2019.  5 episodes over the course of the video described as "irregular, varying frequency and amplitude shaking of the right upper extremity, shoulder that progressed to side-to-side head shaking, large body movements including pelvic thrusting." Based on EEG, these episodes were nonepileptic. Focal slowing in the R parieto-occipital region was consistent with prior craniotomy and surgery. - Continue Vimpat 100mg  BID - HIV4GL Save tube: In process - Ativan 2mg  IV, Q6H PRN. - Flexeril 5mg  tab Bedtime.   Urinary Retention Pt unable to urinate yesterday. Bladder scan at 2100 with 739mL. And straight cath'd for 960mL. UA unremarkable. Pt endorses suprapubic pain 01/07/2019. Pt did receive two doses of haloperidol over the last two days, which can cause urinary retention. Patient urinated 2x overnight on 01/07/2019.   DVT prophylaxis -Lovenox 40 mg subQ daily Fluids - none CODE STATUS - FULL CODE   Dispo: Anticipated discharge today.   [name], MD 01/09/2019, 10:45 AM Pager: [phone]

## 2019-01-09 NOTE — Progress Notes (Addendum)
Patient Mother asking to speak with Primary/medical team MD and Neurologist prior to discharge. Medical doctor on call paged. Will monitor patient. Hoke Baer, Bettina Gavia RN  Dr. Gilford Rile made aware of patient and mothers request.  Neurology also paged as requested through [facility] system. Emeli Goguen, Bettina Gavia RN

## 2019-01-09 NOTE — Progress Notes (Signed)
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 EEGs captured spells that did not have electrographic correlate. 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 The resection cavity certainly could serve as a focus for epileptic seizures; I related to the patient that although EEGs were negative for epileptic seizures this visit, they do not entirely rule out the potential for occurrence of epileptic seizures. Seizure precautions including not to drive until seizure free for 6 months were also reviewed.   A total of 35 minutes elapsed during this patient encounter, with > 50% of time spent in counseling and discussion of her medical condition.    Electronically signed: Dr. Kerney Elbe

## 2019-01-09 NOTE — Discharge Instructions (Signed)
Madeline [name],  Thank you for choosing [facility] for your medical maintenance. You were admitted for your seizures. During your stay you underwent a neurological evaluation. Your Vimpat dose was increased to 100 mg two times daily. You will also have a short course of Flexeril to for use during your transit to the [facility] emergency department today. Please take your medications as prescribed. Please seek assistance if you begin to have new seizures, loss of consciousness, or acute decline in your mental function. It was a pleasure working with you.    Seizure, Adult A seizure is a sudden burst of abnormal electrical activity in the brain. Seizures usually last from 30 seconds to 2 minutes. The abnormal activity temporarily interrupts normal brain function. A seizure can cause many different symptoms depending on where in the brain it starts. What are the causes? Common causes of this condition include:  Fever or infection.  Brain abnormality, injury, bleeding, or tumor.  Low blood sugar.  Metabolic disorders or other conditions that are passed from parent to child (are inherited).  Reaction to a substance, such as a drug or a medicine, or suddenly stopping the use of a substance (withdrawal).  Stroke.  Developmental disorders such as autism or cerebral palsy. In some cases, the cause of this condition may not be known. Some people who have a seizure never have another one. Seizures usually do not cause brain damage or permanent problems unless they are prolonged. A person who has repeated seizures over time without a clear cause has a condition called epilepsy. What increases the risk? You are more likely to develop this condition if you have:  A family history of epilepsy.  Had a tonic-clonic seizure in the past. This is a type of seizure that involves whole-body contraction of muscles and a loss of consciousness.  Autism, cerebral palsy, or other brain disorders.  A history of head  trauma, lack of oxygen at birth, or strokes. What are the signs or symptoms? There are many different types of seizures. The symptoms of a seizure vary depending on the type of seizure you have. Examples of symptoms during a seizure include:  Uncontrollable shaking (convulsions).  Stiffening of the body.  Loss of consciousness.  Head nodding.  Staring.  Not responding to sound or touch.  Loss of bladder or bowel control. Some people have symptoms right before a seizure happens (aura) and right after a seizure happens (postictal). Symptoms before a seizure may include:  Fear or anxiety.  Nausea.  Feeling like the room is spinning (vertigo).  A feeling of having seen or heard something before (dj vu).  Odd tastes or smells.  Changes in vision, such as seeing flashing lights or spots. Symptoms after a seizure may include:  Confusion.  Sleepiness.  Headache.  Weakness on one side of the body. How is this diagnosed? This condition may be diagnosed based on:  A description of your symptoms. Video of your seizures can be helpful.  Your medical history.  A physical exam. You may also have tests, including:  Blood tests.  CT scan.  MRI.  Electroencephalogram (EEG). This test measures electrical activity in the brain. An EEG can predict whether seizures will return (recur).  A spinal tap (also called a lumbar puncture). This is the removal and testing of fluid that surrounds the brain and spinal cord. How is this treated? Most seizures will stop on their own in under 5 minutes, and no treatment is needed. Seizures that last longer  than 5 minutes will usually need treatment. Treatment can include:  Medicines given through an IV.  Avoiding known triggers, such as medicines that you take for another condition.  Medicines to treat epilepsy (antiepileptics), if epilepsy caused your seizures.  Surgery to stop seizures, if you have epilepsy that does not respond  to medicines. Follow these instructions at home: Medicines  Take over-the-counter and prescription medicines only as told by your health care provider.  Avoid any substances that may prevent your medicine from working properly, such as alcohol. Activity  Do not drive, swim, or do any other activities that would be dangerous if you had another seizure. Wait until your health care provider says it is safe to do them.  If you live in the [place] check with your local DMV (department of motor vehicles) to find out about local driving laws. Each state has specific rules about when you can legally return to driving.  Get enough rest. Lack of sleep can make seizures more likely to occur. Educating others Teach friends and family what to do if you have a seizure. They should:  Lay you on the ground to prevent a fall.  Cushion your head and body.  Loosen any tight clothing around your neck.  Turn you on your side. If vomiting occurs, this helps keep your airway clear.  Not hold you down. Holding you down will not stop the seizure.  Not put anything into your mouth.  Know whether or not you need emergency care. For example, they should get help right away if you have a seizure that lasts longer than 5 minutes or have several seizures in a row.  Stay with you until you recover.  General instructions  Contact your health care provider each time you have a seizure.  Avoid anything that has ever triggered a seizure for you.  Keep a seizure diary. Record what you remember about each seizure, especially anything that might have triggered the seizure.  Keep all follow-up visits as told by your health care provider. This is important. Contact a health care provider if:  You have another seizure.  You have seizures more often.  Your seizure symptoms change.  You continue to have seizures with treatment.  You have symptoms of an infection or illness. This might increase your risk of  having a seizure. Get help right away if:  You have a seizure that: ? Lasts longer than 5 minutes. ? Is different than previous seizures. ? Leaves you unable to speak or use a part of your body. ? Makes it harder to breathe.  You have: ? A seizure after a head injury. ? Multiple seizures in a row. ? Confusion or a severe headache right after a seizure.  You do not wake up immediately after a seizure.  You injure yourself during a seizure. These symptoms may represent a serious problem that is an emergency. Do not wait to see if the symptoms will go away. Get medical help right away. Call your local emergency services (911 in the [place] Do not drive yourself to the hospital. Summary  Seizures are caused by abnormal electrical activity in the brain. The activity disrupts normal brain function and can cause various symptoms, such as convulsions, abnormal movements, or a change in consciousness.  There are many causes of seizures, including illnesses, medicines, genetic conditions, head injuries, strokes, tumors, substance abuse, or substance withdrawal.  Most seizures will stop on their own in under 5 minutes. Seizures that last longer than  5 minutes are a medical emergency and require immediate treatment.  Many medicines are used to treat seizures. Take over-the-counter and prescription medicines only as told by your health care provider. This information is not intended to replace advice given to you by your health care provider. Make sure you discuss any questions you have with your health care provider. Document Released: 03/04/2000 Document Revised: 05/25/2018 Document Reviewed: 05/25/2018 Elsevier Patient Education  [place].

## 2019-01-09 NOTE — Progress Notes (Deleted)
Patient and family given discharge instructions, medication list and prescriptions sent to pharmacy. IV and tele dcd. All questions answered will discharge home as ordered. Madeline [name], Bettina Gavia RN

## 2019-01-12 DIAGNOSIS — F445 Conversion disorder with seizures or convulsions: Secondary | ICD-10-CM | POA: Insufficient documentation

## 2019-01-14 ENCOUNTER — Ambulatory Visit: Payer: Medicaid Other | Admitting: Neurology

## 2019-01-14 MED ORDER — QC CLEAR STRIPS MISC
100.00 | Status: DC
Start: 2019-01-14 — End: 2019-01-14

## 2019-01-14 MED ORDER — Medication
10.00 | Status: DC
Start: 2019-01-14 — End: 2019-01-14

## 2019-01-14 MED ORDER — CYCLOBENZAPRINE HCL 10 MG PO TABS
5.00 | ORAL_TABLET | ORAL | Status: DC
Start: ? — End: 2019-01-14

## 2019-01-14 MED ORDER — Medication
5.00 | Status: DC
Start: 2019-01-14 — End: 2019-01-14

## 2019-01-14 MED ORDER — ONDANSETRON HCL 4 MG PO TABS
4.00 | ORAL_TABLET | ORAL | Status: DC
Start: ? — End: 2019-01-14

## 2019-01-14 MED ORDER — TUSSI PRES-B 2-15-200 MG/5ML PO LIQD
0.50 | ORAL | Status: DC
Start: ? — End: 2019-01-14

## 2019-01-14 MED ORDER — SECURI-T STOMA PSTE
40.00 | PASTE | Status: DC
Start: 2019-01-15 — End: 2019-01-14

## 2019-01-14 MED ORDER — ACETAMINOPHEN 325 MG PO TABS
650.00 | ORAL_TABLET | ORAL | Status: DC
Start: ? — End: 2019-01-14

## 2019-01-14 MED ORDER — AMITRIPTYLINE HCL 25 MG PO TABS
12.50 | ORAL_TABLET | ORAL | Status: DC
Start: 2019-01-14 — End: 2019-01-14

## 2019-01-14 MED ORDER — Medication
1000.00 | Status: DC
Start: 2019-01-15 — End: 2019-01-14

## 2019-02-25 DIAGNOSIS — R9401 Abnormal electroencephalogram [EEG]: Secondary | ICD-10-CM | POA: Insufficient documentation

## 2019-02-25 DIAGNOSIS — F445 Conversion disorder with seizures or convulsions: Secondary | ICD-10-CM | POA: Insufficient documentation

## 2019-07-27 DIAGNOSIS — S0990XA Unspecified injury of head, initial encounter: Secondary | ICD-10-CM | POA: Insufficient documentation

## 2020-01-15 ENCOUNTER — Encounter (HOSPITAL_COMMUNITY): Payer: Self-pay | Admitting: Internal Medicine

## 2020-01-15 ENCOUNTER — Observation Stay (HOSPITAL_COMMUNITY)
Admission: EM | Admit: 2020-01-15 | Discharge: 2020-01-16 | Disposition: A | Discharge status: Home / Self Care | Payer: Medicaid Other | Attending: Internal Medicine | Admitting: Internal Medicine

## 2020-01-15 ENCOUNTER — Emergency Department (HOSPITAL_COMMUNITY): Payer: Medicaid Other

## 2020-01-15 DIAGNOSIS — Z20822 Contact with and (suspected) exposure to covid-19: Secondary | ICD-10-CM | POA: Insufficient documentation

## 2020-01-15 DIAGNOSIS — R569 Unspecified convulsions: Secondary | ICD-10-CM | POA: Diagnosis not present

## 2020-01-15 DIAGNOSIS — Z85841 Personal history of malignant neoplasm of brain: Secondary | ICD-10-CM | POA: Diagnosis not present

## 2020-01-15 HISTORY — DX: Unspecified convulsions: R56.9

## 2020-01-15 HISTORY — DX: Generalized anxiety disorder: F41.1

## 2020-01-15 HISTORY — DX: Adjustment disorder, unspecified: F43.20

## 2020-01-15 HISTORY — DX: Conversion disorder with seizures or convulsions: F44.5

## 2020-01-15 LAB — CBC WITH DIFFERENTIAL/PLATELET
Abs Immature Granulocytes: 0.03 10*3/uL (ref 0.00–0.07)
Basophils Absolute: 0.1 10*3/uL (ref 0.0–0.1)
Basophils Relative: 1 %
Eosinophils Absolute: 0.2 10*3/uL (ref 0.0–0.5)
Eosinophils Relative: 2 %
HCT: 40.4 % (ref 36.0–46.0)
Hemoglobin: 12.9 g/dL (ref 12.0–15.0)
Immature Granulocytes: 0 %
Lymphocytes Relative: 18 %
Lymphs Abs: 2 10*3/uL (ref 0.7–4.0)
MCH: 29.8 pg (ref 26.0–34.0)
MCHC: 31.9 g/dL (ref 30.0–36.0)
MCV: 93.3 fL (ref 80.0–100.0)
Monocytes Absolute: 1 10*3/uL (ref 0.1–1.0)
Monocytes Relative: 8 %
Neutro Abs: 8.1 10*3/uL — ABNORMAL HIGH (ref 1.7–7.7)
Neutrophils Relative %: 71 %
Platelets: 351 10*3/uL (ref 150–400)
RBC: 4.33 MIL/uL (ref 3.87–5.11)
RDW: 12.3 % (ref 11.5–15.5)
WBC: 11.5 10*3/uL — ABNORMAL HIGH (ref 4.0–10.5)
nRBC: 0 % (ref 0.0–0.2)

## 2020-01-15 LAB — COMPREHENSIVE METABOLIC PANEL
ALT: 14 U/L (ref 0–44)
AST: 16 U/L (ref 15–41)
Albumin: 3.8 g/dL (ref 3.5–5.0)
Alkaline Phosphatase: 63 U/L (ref 38–126)
Anion gap: 12 (ref 5–15)
BUN: 6 mg/dL (ref 6–20)
CO2: 21 mmol/L — ABNORMAL LOW (ref 22–32)
Calcium: 9.5 mg/dL (ref 8.9–10.3)
Chloride: 105 mmol/L (ref 98–111)
Creatinine, Ser: 0.69 mg/dL (ref 0.44–1.00)
GFR, Estimated: >60 mL/min (ref >60)
Glucose, Bld: 104 mg/dL — ABNORMAL HIGH (ref 70–99)
Potassium: 3.4 mmol/L — ABNORMAL LOW (ref 3.5–5.1)
Sodium: 138 mmol/L (ref 135–145)
Total Bilirubin: 0.5 mg/dL (ref 0.3–1.2)
Total Protein: 7 g/dL (ref 6.5–8.1)

## 2020-01-15 LAB — RESP PANEL BY RT PCR (RSV, FLU A&B, COVID)
Influenza A by PCR: NEGATIVE
Influenza B by PCR: NEGATIVE
Respiratory Syncytial Virus by PCR: NEGATIVE
SARS Coronavirus 2 by RT PCR: NEGATIVE

## 2020-01-15 LAB — HIV ANTIBODY (ROUTINE TESTING W REFLEX): HIV Screen 4th Generation wRfx: NONREACTIVE

## 2020-01-15 MED ORDER — LORAZEPAM 2 MG/ML IJ SOLN
INTRAMUSCULAR | Status: AC
  Administered 2020-01-15: 1 mg via INTRAVENOUS
  Filled 2020-01-15: qty 1

## 2020-01-15 MED ORDER — LACTATED RINGERS IV BOLUS
500.0000 mL | Freq: Once | INTRAVENOUS | Status: AC
  Administered 2020-01-15: 500 mL via INTRAVENOUS

## 2020-01-15 MED ORDER — ENOXAPARIN SODIUM 40 MG/0.4ML ~~LOC~~ SOLN
40.0000 mg | SUBCUTANEOUS | Status: DC
  Administered 2020-01-15: 40 mg via SUBCUTANEOUS
  Filled 2020-01-15: qty 0.4

## 2020-01-15 MED ORDER — LORAZEPAM 2 MG/ML IJ SOLN: 1.0000 mg | Freq: Once | INTRAMUSCULAR | Status: AC

## 2020-01-15 MED ORDER — LACOSAMIDE 50 MG PO TABS
150.0000 mg | ORAL_TABLET | Freq: Two times a day (BID) | ORAL | Status: DC
  Administered 2020-01-15 – 2020-01-16 (×2): 150 mg via ORAL
  Filled 2020-01-15 (×2): qty 3

## 2020-01-15 NOTE — ED Provider Notes (Signed)
[facility] EMERGENCY DEPARTMENT Provider Note   CSN: [id number] Arrival date & time: 01/15/20  1627     History Chief Complaint  Patient presents with  . Seizures    Madeline [name] is a 19 y.o. female.  HPI   19 year old female with a history of brain cancer s/p multiple surgeries, epileptic and nonepileptic seizures, who presents the emergency department today for evaluation of seizure-like activity.  She is accompanied by her teacher who assists with the history.    EMS reported that patient was found on the ground.  Her eyes were deviated to the right, she was moving around and was nonresponsive.  This episode lasted for about 10 minutes until EMS arrived and gave 5 of Versed.  Following this the patient became responsive to questions.  Her teacher at bedside states that she did not sustain any head trauma and was lowered to the ground by other students.  He states that he has witnessed her seizure-like activity before but this episode lasted longer than normal.  The patient tells me that she has breakthrough seizures typically about once per week however her last breakthrough seizure was not for about a month.  States these seizures are typically short and she recovers easily however the seizure today was longer than normal and not typical for her.  States she takes Vimpat, no recent medication changes.  She reports medication compliance.  She denies any decreased sleep, drug or alcohol use.  States she has had a little bit of a sore throat and a cough recently.  She had a Covid test yesterday which was negative.  She denies any other complaints at this time.  Reviewed chart per care everywhere.  Patient has followed with [facility] neurology.  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  Has multiple types of seizures.  She last followed up earlier this month.  She had MRI earlier this month which was felt overall to be stable  Past Medical History:   Diagnosis Date  . Adjustment disorder   . Cancer ([place])    brain cancer  . GAD (generalized anxiety disorder)   . Psychogenic nonepileptic seizure   . Seizure [facility])     Patient Active Problem List   Diagnosis Date Noted  . Seizure ([place]) 01/15/2020  . Seizure-like activity ([place]) 01/06/2019    Past Surgical History:  Procedure Laterality Date  . tumer removal in brain       OB History   No obstetric history on file.     Family History  Problem Relation Age of Onset  . Stroke Mother   . Heart attack Maternal Grandfather   . Breast cancer Paternal Grandmother     Social History   Tobacco Use  . Smoking status: Never Smoker  Substance Use Topics  . Alcohol use: Never  . Drug use: Yes    Types: Marijuana    Comment: medical marijuana, [place] resident here for school, uses rarely for headaches only    Home Medications Prior to Admission medications   Medication Sig Start Date End Date Taking? Authorizing Provider  baclofen (LIORESAL) 10 MG tablet Take 10 mg by mouth 3 (three) times daily. 11/29/19  Yes [provider]  DULoxetine (CYMBALTA) 30 MG capsule Take 30 mg by mouth daily. Take along with 60 mg=90 mg 11/01/19  Yes [provider]  DULoxetine (CYMBALTA) 60 MG capsule Take 60 mg by mouth daily. Take along with 30 mg=90 mg   Yes [provider]  hydrOXYzine (ATARAX/VISTARIL) 10 MG tablet Take 10 mg by mouth 3 (three) times daily as needed for anxiety.  08/26/19  Yes [provider]  memantine (NAMENDA) 10 MG tablet Take 10 mg by mouth 2 (two) times daily.  11/06/17 01/15/20 Yes [provider]  ondansetron (ZOFRAN) 4 MG tablet Take 4 mg by mouth every 8 (eight) hours as needed for nausea or vomiting.  12/21/17  Yes [provider]  VIMPAT 150 MG TABS Take 1 tablet by mouth 2 (two) times daily. 10/10/19  Yes [provider]  clonazePAM (KLONOPIN) 0.5 MG tablet Take 1 tablet (0.5 mg total) by mouth once for 1  dose. Patient taking differently: Take 0.5 mg by mouth See admin instructions. Take 0.5 mg by mouth two times a a day for 5 days, beginning on 01/02/2019 01/01/19 01/06/19  Quintella Reichert, MD  cyclobenzaprine (FLEXERIL) 5 MG tablet Take 1 tablet (5 mg total) by mouth 3 (three) times daily as needed for muscle spasms. Patient not taking: Reported on 01/15/2020 01/09/19   [name], MD  lacosamide 100 MG TABS Take 1 tablet (100 mg total) by mouth 2 (two) times daily. Patient not taking: Reported on 01/15/2020 01/09/19   [name], MD    Allergies    Ibuprofen, Keppra [levetiracetam], and Penicillins  Review of Systems   Review of Systems  Constitutional: Negative for fever.  HENT: Negative for ear pain and sore throat.   Eyes: Negative for visual disturbance.  Respiratory: Negative for cough and shortness of breath.   Cardiovascular: Negative for chest pain.  Gastrointestinal: Negative for abdominal pain, constipation, diarrhea, nausea and vomiting.  Genitourinary: Negative for dysuria and hematuria.  Musculoskeletal: Negative for back pain.  Skin: Negative for rash.  Neurological: Positive for seizures. Negative for weakness, numbness and headaches.  All other systems reviewed and are negative.   Physical Exam Updated Vital Signs BP 123/76   Pulse 78   Temp 98.6 F (37 C) (Oral)   Resp 18   SpO2 98%   Physical Exam Vitals and nursing note reviewed.  Constitutional:      General: She is not in acute distress.    Appearance: She is well-developed.  HENT:     Head: Normocephalic and atraumatic.  Eyes:     Conjunctiva/sclera: Conjunctivae normal.  Cardiovascular:     Rate and Rhythm: Normal rate and regular rhythm.     Heart sounds: No murmur heard.   Pulmonary:     Effort: Pulmonary effort is normal. No respiratory distress.     Breath sounds: Normal breath sounds.  Abdominal:     General: Bowel sounds are normal.     Palpations: Abdomen is soft.      Tenderness: There is no abdominal tenderness. There is no guarding or rebound.  Musculoskeletal:     Cervical back: Neck supple.  Skin:    General: Skin is warm and dry.  Neurological:     Mental Status: She is alert.     Comments: Alert, oriented. Appears sleepy but is able to answer questions appropriately and follow commands. Cranial nerves II-XII are intact and pt is moving all extremities with normal strength, sensation and coordination. She does have intermittent abnormal head movements and twitching during exam. She also has periods of decreased responsiveness that are brief and last less then a minute. Following these episodes she remains alert, oriented, and able to follow commands.      ED Results / Procedures / Treatments  Labs (all labs ordered are listed, but only abnormal results are displayed) Labs Reviewed  CBC WITH DIFFERENTIAL/PLATELET - Abnormal; Notable for the following components:      Result Value   WBC 11.5 (*)    Neutro Abs 8.1 (*)    All other components within normal limits  COMPREHENSIVE METABOLIC PANEL - Abnormal; Notable for the following components:   Potassium 3.4 (*)    CO2 21 (*)    Glucose, Bld 104 (*)    All other components within normal limits  RESP PANEL BY RT PCR (RSV, FLU A&B, COVID)  LACOSAMIDE  URINALYSIS, ROUTINE W REFLEX MICROSCOPIC  PREGNANCY, URINE  HIV ANTIBODY (ROUTINE TESTING W REFLEX)  COMPREHENSIVE METABOLIC PANEL  CBC  CBG MONITORING, ED    EKG EKG Interpretation  Date/Time:  Wednesday January 15 2020 16:32:59 EDT Ventricular Rate:  81 PR Interval:    QRS Duration: 98 QT Interval:  378 QTC Calculation: 439 R Axis:   93 Text Interpretation: Sinus rhythm Borderline right axis deviation No significant change since last tracing Confirmed by Dorie Rank [phone]) on 01/15/2020 4:38:07 PM   Radiology No results found.  Procedures Procedures (including critical care time)  Medications Ordered in ED Medications   lacosamide (VIMPAT) tablet 150 mg (150 mg Oral Given 01/15/20 2121)  enoxaparin (LOVENOX) injection 40 mg (40 mg Subcutaneous Given 01/15/20 2023)  LORazepam (ATIVAN) injection 1 mg (1 mg Intravenous Given 01/15/20 1721)  lactated ringers bolus 500 mL (500 mLs Intravenous New Bag/Given 01/15/20 2024)    ED Course  I have reviewed the triage vital signs and the nursing notes.  Pertinent labs & imaging results that were available during my care of the patient were reviewed by me and considered in my medical decision making (see chart for details).    MDM Rules/Calculators/A&P                          19 year old female with history of brain cancer, seizures, who presents the emergency department today for seizure-like activity noted while she was at school prior to arrival.  Had an episode that lasted for 10 minutes.  She also had multiple episodes of jerking head movements and twitching of the right shoulder while in the emergency department.  She was given 5 mg of Versed with EMS and 1 mg of Ativan here in the emergency department. Unclear if these are partial seizures or consistent with her nonepileptic seizures. Given persistent sxs, neurology will be consulted.  5:30 PM CONSULT with Dr. Leonel Ramsay with neurology. He will see patient at bedside.   5:34 PM Dr. Leonel Ramsay at bedside. He recommends overnight EEG.    Labs with mild leukocytosis, mild hypokalemia, and slightly low bicarb. Otherwise reasurribng. covid neg.   7:45 PM CONSULT with Dr. Eileen Stanford who accepts patient for admission.   Final Clinical Impression(s) / ED Diagnoses Final diagnoses:  Seizure-like activity [facility])    Rx / [place] Orders ED Discharge Orders    None       Bishop Dublin 01/15/20 2143    Dorie Rank, MD 01/19/20 [phone]

## 2020-01-15 NOTE — Progress Notes (Signed)
LTM EEG hooked up and running - no initial skin breakdown - push button tested - neuro notified.  

## 2020-01-15 NOTE — ED Notes (Signed)
Pt continues to rest comfortably in stretcher with eeg monitor, vss on cardiac monitor, nadn, pt on cell phone without any distress. Seizure precautions maintained in place. Side rails up and padded, bed in lowest position.

## 2020-01-15 NOTE — ED Notes (Signed)
Pt resting in stretcher, vss on monitor, aao4, gcs15, teacher at bedside, Pt on eeg monitor at this time. Side rails up and bed  in lowest postion, NADN.

## 2020-01-15 NOTE — ED Triage Notes (Signed)
Patient BIB [facility] from [facility] for witnessed seizure activity lasting approximately ten minutes. When EMS arrived to patient, patient was unresponsive with a fixed right sided gaze. Patient given 5mg  versed IM, patient began following bystander movement with her eyes. Patient arrives very lethargic, oriented to place and situation.

## 2020-01-15 NOTE — H&P (Addendum)
Date: 01/15/2020               Patient Name:  Madeline [name] MRN: [id number]  DOB: 09-14-2000 Age / Sex: 19 y.o., female   PCP: [facility] [place] Service: Internal Medicine Teaching Service         Attending Physician: Dr. Daryll Drown    First Contact: Dr. Shon Baton Pager: [phone]  Second Contact: Dr. Gilford Rile Pager: [phone]       After Hours (After 5p/  First Contact Pager: [phone]  weekends / holidays): Second Contact Pager: [phone]   Chief Complaint: seizure-like activity  History of Present Illness:   Madeline [name] is a 19 y.o. 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 History obtained from patient, friend Madeline [name] [phone]), and from ED from her teacher who accompanied her to the ED. While in class today, she felt her R arm and head start twitching, which are consistent with prior seizures, and felt like she was going to have a more significant seizure. Her friend states that it started with the R arm and head, but then involved both sides. States that her eyes rolled back and she held her arms extended and had shaking of upper body bilaterally. She was largely unresponsive during this time, but once squeezed her friend's hand when asked to. This went on for approximately 8 minutes. EMS arrived at that time and by that time she had slowly become more responsive. Per EMS, she had had fixed R-sided gaze. They administered 5mg  Versed and she then began to track with her eyes. She remained very lethargic. On presentation to the ED was reportedly still lethargic, but by the time of our interview she was alert and oriented, cognition appeared grossly normal.  She states that she frequently has those jerking episodes, but they usually do not cross midline.  This time and on other unusual occasions they do. She states she can usually answer questions and follow commands albeit slowly during her seizures. Per bystander description, this does not sound like her typical seizures. On chart review, her neurologist at [facility] believes she has both epileptiform and non-epileptiform seizures. The description of this sounds like they are atypical for her and seems to have both non-epileptiform and epileptiform elements.   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 Endorses compliance with all her medications. She denies any recent illnesses but states that she has a "sore throat" from singing, which is a frequent occurrence for her. She denies fevers, rashes, congestion, chest pain, difficulty with urination, dysuria, abdominal pain, shortness of breath, vision changes but did experience double vision while coming out of the seizure. Also endorses cough productive of yellow-greenish sputum, nausea for which she takes Zofran occasionally. She states that about a month ago "she passed out" after taking a 20 minute nap, stood up and had a syncopal episode but has not happened before or since.   ED course: Had several more episodes " jerking head movements and twitching of the right shoulder." Received 1mg  of Ativan in ED. Neuro consulted and recommend overnight EEG.  Meds:  Current Outpatient Medications  Medication Instructions  . baclofen (LIORESAL) 10 mg, Oral,  3 times daily  . clonazePAM (KLONOPIN) 0.5 mg, Oral,  Once  . cyclobenzaprine (FLEXERIL) 5 mg, Oral, 3 times daily PRN  . DULoxetine (CYMBALTA) 30 mg, Oral, Daily, Take along with 60 mg=90 mg  . DULoxetine (CYMBALTA) 60 mg, Oral, Daily, Take along with 30 mg=90 mg   . hydrOXYzine (ATARAX/VISTARIL) 10 mg, Oral, 3 times daily PRN  . Lacosamide 100 mg, Oral, 2 times daily  . memantine (NAMENDA) 10 mg, Oral, 2  times daily  . ondansetron (ZOFRAN) 4 mg, Oral, Every 8 hours PRN  . VIMPAT 150 MG TABS 1 tablet, Oral, 2 times daily    Allergies: Allergies as of 01/15/2020 - Review Complete 01/15/2020  Allergen Reaction Noted  . Ibuprofen Other (See Comments) 01/06/2019  . Keppra [levetiracetam] Other (See Comments) 01/01/2019  . Penicillins Rash 05/01/2018   Past Medical History:  Diagnosis Date  . Adjustment disorder   . Cancer ([place])    brain cancer  . GAD (generalized anxiety disorder)   . Psychogenic nonepileptic seizure   . Seizure [facility])     Family History:  Family History  Problem Relation Age of Onset  . Stroke Mother   . Heart attack Maternal Grandfather   . Breast cancer Paternal Grandmother    Social History:  Social History   Tobacco Use  . Smoking status: Never Smoker  Substance Use Topics  . Alcohol use: Never  . Drug use: Yes    Types: Marijuana    Comment: medical marijuana, [place] resident here for school, uses rarely for headaches only  Voice major at [employer]: Review of Systems  Constitutional: Positive for chills (chronic). Negative for fever.  HENT: Positive for sore throat.   Eyes: Positive for double vision (immediately after seizure).  Respiratory: Positive for cough (mild, produces yellow-green sputum). Negative for shortness of breath.   Cardiovascular: Negative for chest pain and palpitations.  Gastrointestinal: Positive for nausea. Negative for abdominal pain, diarrhea and vomiting.  Genitourinary: Negative for dysuria and frequency.  Musculoskeletal: Negative for back pain and myalgias.  Skin: Negative for itching and rash.  Neurological: Positive for seizures, loss of consciousness and headaches (migraines).     Physical Exam: Physical Exam Vitals and nursing note reviewed.  Constitutional:      General: She is not in acute distress.    Appearance: Normal appearance. She is not ill-appearing.  HENT:     Head: Normocephalic and  atraumatic.     Right Ear: External ear normal.     Left Ear: External ear normal.     Nose: Nose normal.     Mouth/Throat:     Mouth: Mucous membranes are moist.  Eyes:     Extraocular Movements: Extraocular movements intact.     Conjunctiva/sclera: Conjunctivae normal.     Pupils: Pupils are equal, round, and reactive to light.  Cardiovascular:     Rate and Rhythm: Normal rate and regular rhythm.     Heart sounds: Normal heart sounds. No murmur heard.  No friction rub. No gallop.   Pulmonary:     Effort: Pulmonary effort is normal.     Breath sounds: Normal breath sounds. No wheezing, rhonchi or rales.  Abdominal:     General: Abdomen is flat. There is no distension.     Palpations: Abdomen is soft.     Tenderness: There is no abdominal tenderness. There is no guarding.  Musculoskeletal:        General: No deformity. Normal range of motion.  Cervical back: Normal range of motion. No rigidity.  Skin:    General: Skin is warm and dry.  Neurological:     General: No focal deficit present.     Mental Status: She is alert and oriented to person, place, and time.     Comments: CN intact Strength 5/5 throughout Sensation to light touch intact throughout No dysmetria  Psychiatric:        Mood and Affect: Mood normal.        Behavior: Behavior normal.      EKG: personally reviewed my interpretation is normal sinus rhythm, T wave inversions in V1 unchanged from prior, mild R axis deviation   Assessment & Plan by Problem: Active Problems:   Seizure ([place])   Anetria Harwick is a 19 y.o. 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 Some elements of today's seizures sound tonic-clonic in nature.  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 Then had recurrence in 2021, again received resection and radiation, as well as immunotherapy with evacizumab. Followed at [facility] and getting brain MRI every 3 months and spine MRI yearly. Last brain MRI on 12/26/19 with no significant changes from imaging in July.   Neurologist at [facility] believes she has had both epileptiform and non-epileptiform seizures, with the non-epileptiform now dominating. Notes that her R-sided tremors that she gets with her seizures are not consistent with epileptic seizures as they on the same side as her tumor. Notes that they occur most frequently in proximity to stressful times. She is prescribed Vimpat 150mg  q12 and endorses compliance. Has also been referred to movement disorders clinic for the tremors, no notes available from them. Neurologist treating with neuropsych therapy, Atarax for anxiety, baclofen, and Vimpat. Followed at [facility] for chronic migraines as well, prescribed Fioricet and flexeril.   This current episode was described as R shoulder and head twitching at first, but then became unresponsive, eyes rolled back in her head, and had bilateral stiffness and shaking lasting about 8 minutes. Had postictal state afterwards. EMS gave Versed 5mg  and ED gave Ativan 1mg . Had several episodes of R head and shoulder jerking in the ED but no further tonic-clonic activity reported. Had normal cognition and neuro exam at time of our interview.   -neurology following, appreciate recommendations -long-term EEG tonight -continue home lacosamide 150mg  BID -f/u lacosamide level -s/p Versed 5mg  by EMS, Ativan 1mg  in ED  #GAD #Adjustment disorder Undergoing neuropsych treatment for this and for Psychogenic  nonepileptic seizures. Prescribed duloxetine 90mg  daily at home. Also hydroxyzine prn. -consider restart home duloxetine in AM  Dispo: Admit patient to Observation with expected length of stay less than 2 midnights.  Signed: Andrew Au, MD 01/15/2020, 10:55 PM  Pager: [phone]  After 5pm on weekdays and 1pm on weekends: On Call pager: [phone]

## 2020-01-15 NOTE — Consult Note (Signed)
Neurology Consultation Reason for Consult: Possible seizures Referring Physician: Hillard Danker  CC: Possible seizures  History is obtained from: Patient  HPI: Madeline [name] is a 19 y.o. female who has a history of ependymoma resected in 2019 who began having seizures in 2020.  Reportedly she had both psychogenic and actual epileptic seizures, but reviewing notes from 2020, she only had nonepileptic events during that recording.  Today, she had recurrent episodes concerning for seizures and was given multiple rounds of Ativan and Versed.  Due to persistent decreased mental status, neurology has been consulted.  She is very sedated and therefore history is limited.  The description is also somewhat limited, though side to side head shaking was described.  ROS: A 14 point ROS was performed and is negative except as noted in the HPI.  Past Medical History:  Diagnosis Date  . Cancer [facility])    brain cancer     Family history: Unable to obtain due to altered mental status.   Social History: Unable to obtain due to altered mental status.  Exam: Current vital signs: BP 116/78 (BP Location: Right Arm)   Pulse 82   Temp 98.6 F (37 C) (Oral)   Resp 12   SpO2 100%  Vital signs in last 24 hours: Temp:  [98.6 F (37 C)] 98.6 F (37 C) (10/27 1635) Pulse Rate:  [82-86] 82 (10/27 1923) Resp:  [12-20] 12 (10/27 1923) BP: (112-116)/(73-79) 116/78 (10/27 1923) SpO2:  [100 %] 100 % (10/27 1923)   Physical Exam  Constitutional: Appears well-developed and well-nourished.  Psych: Affect appropriate to situation Eyes: No scleral injection HENT: No OP obstrucion MSK: no joint deformities.  Cardiovascular: Normal rate and regular rhythm.  Respiratory: Effort normal, non-labored breathing GI: Soft.  No distension. There is no tenderness.  Skin: WDI  Neuro: Mental Status: Patient is drowsy, but with stimulation she is able to awaken and answer some simple questions. Cranial Nerves: II: Visual  Fields are full. Pupils are equal, round, and reactive to light.   III,IV, VI: EOMI without ptosis or diploplia.  She does have horizontal nystagmus bilaterally. V: Facial sensation is symmetric to temperature VII: Facial movement is symmetric.  VIII: hearing is intact to voice X: Uvula elevates symmetrically XI: Shoulder shrug is symmetric. XII: tongue is midline without atrophy or fasciculations.  Motor: Tone is normal. Bulk is normal. 5/5 strength was present in all four extremities.  Sensory: Sensation is symmetric to light touch and temperature in the arms and legs. Cerebellar: Does not perform  I have reviewed labs in epic and the results pertinent to this consultation are: SEM 3.4  I have reviewed the images obtained: MRI brain from 2020-parietal lobe craniotomy right  Impression: 19 year old female with multiple recurrent episodes which by description sounds similar to her previous episodes which were documented as nonepileptic.  Given her high risk of epileptic seizures, long convalescence, I do think that further characterization would be reasonable at this time.  Recommendations: 1) continuous EEG to capture episodes 2) continue home Vimpat 150 twice daily 3) Neurology to follow  Roland Rack, MD [facility] [phone]  If 7pm- 7am, please page neurology on call as listed in [place].

## 2020-01-15 NOTE — ED Notes (Signed)
Admitting at bedside 

## 2020-01-16 DIAGNOSIS — R569 Unspecified convulsions: Secondary | ICD-10-CM | POA: Diagnosis not present

## 2020-01-16 LAB — COMPREHENSIVE METABOLIC PANEL
ALT: 13 U/L (ref 0–44)
AST: 15 U/L (ref 15–41)
Albumin: 3.4 g/dL — ABNORMAL LOW (ref 3.5–5.0)
Alkaline Phosphatase: 59 U/L (ref 38–126)
Anion gap: 8 (ref 5–15)
BUN: 7 mg/dL (ref 6–20)
CO2: 26 mmol/L (ref 22–32)
Calcium: 9.3 mg/dL (ref 8.9–10.3)
Chloride: 106 mmol/L (ref 98–111)
Creatinine, Ser: 0.68 mg/dL (ref 0.44–1.00)
GFR, Estimated: >60 mL/min (ref >60)
Glucose, Bld: 97 mg/dL (ref 70–99)
Potassium: 3.8 mmol/L (ref 3.5–5.1)
Sodium: 140 mmol/L (ref 135–145)
Total Bilirubin: 0.7 mg/dL (ref 0.3–1.2)
Total Protein: 6.2 g/dL — ABNORMAL LOW (ref 6.5–8.1)

## 2020-01-16 LAB — RESP PANEL BY RT PCR (RSV, FLU A&B, COVID)
Influenza A by PCR: NEGATIVE
Influenza B by PCR: NEGATIVE
Respiratory Syncytial Virus by PCR: NEGATIVE
SARS Coronavirus 2 by RT PCR: NEGATIVE

## 2020-01-16 LAB — CBC
HCT: 39.3 % (ref 36.0–46.0)
Hemoglobin: 12.4 g/dL (ref 12.0–15.0)
MCH: 29.7 pg (ref 26.0–34.0)
MCHC: 31.6 g/dL (ref 30.0–36.0)
MCV: 94 fL (ref 80.0–100.0)
Platelets: 342 10*3/uL (ref 150–400)
RBC: 4.18 MIL/uL (ref 3.87–5.11)
RDW: 12.7 % (ref 11.5–15.5)
WBC: 10.7 10*3/uL — ABNORMAL HIGH (ref 4.0–10.5)
nRBC: 0 % (ref 0.0–0.2)

## 2020-01-16 LAB — GROUP A STREP BY PCR: Group A Strep by PCR: NOT DETECTED

## 2020-01-16 MED ORDER — ACETAMINOPHEN 325 MG PO TABS
650.0000 mg | ORAL_TABLET | Freq: Four times a day (QID) | ORAL | Status: DC | PRN
  Administered 2020-01-16: 650 mg via ORAL
  Filled 2020-01-16: qty 2

## 2020-01-16 MED ORDER — ONDANSETRON HCL 4 MG/2ML IJ SOLN
4.0000 mg | Freq: Four times a day (QID) | INTRAMUSCULAR | Status: DC | PRN
  Administered 2020-01-16: 4 mg via INTRAVENOUS
  Filled 2020-01-16: qty 2

## 2020-01-16 NOTE — Progress Notes (Signed)
vLTM EEG complete. No skin breakdown 

## 2020-01-16 NOTE — Procedures (Signed)
Patient Name: Madeline [name]  MRN: [id number]  Epilepsy Attending: Lora Havens  Referring Physician/Provider:  Duration: 01/15/2020 1813 to 01/16/2020 1038  Patient history: 19 year old female with multiple recurrent episodes concerning for seizures.  EEG to evaluate for seizures.  Level of alertness: Awake, asleep  AEDs during EEG study: Lacosamide  Technical aspects: This EEG study was done with scalp electrodes positioned according to the 10-20 International system of electrode placement. Electrical activity was acquired at a sampling rate of 500Hz  and reviewed with a high frequency filter of 70Hz  and a low frequency filter of 1Hz . EEG data were recorded continuously and digitally stored.   Description: The posterior dominant rhythm consists of 8 Hz activity of moderate voltage (25-35 uV) seen predominantly in posterior head regions, asymmetric ( R>L) and reactive to eye opening and eye closing. Sleep was characterized by vertex waves, sleep spindles (12 to 14 Hz), maximal frontocentral region. EEG showed intermittent 5 to 6 Hz theta slowing in right parieto-occipital region.  Hyperventilation and photic stimulation were not performed.     Three events were recorded on 01/16/2020.  During each event patient was noted to have vigorous head shaking from side to side with eyes closed.  During one of the seizures, patient was noted to switch sides from left lateral to right lateral position.  Also, jerking would stop intermittently and then resume again in a few seconds.  Concomitant EEG, before, during and after the event did not show any EEG changes suggest seizure.  ABNORMALITY -Intermittent slow, right parieto-occipital region -Background asymmetry, right > left  IMPRESSION: This study is suggestive of cortical dysfunction in right parieto- occipital region consistent with underlying craniotomy.  Three events recorded as described above without concomitant EEG change and were  nonepileptic.  No seizures or epileptiform discharges were seen throughout the recording.  Sedrick Tober [name]

## 2020-01-16 NOTE — ED Notes (Signed)
Lunch Tray Ordered @ 1042. 

## 2020-01-16 NOTE — ED Notes (Signed)
MD rounding team at bedside

## 2020-01-16 NOTE — ED Notes (Addendum)
This rn noted When sleeping pt noted to have facial twitching, and after 30 seconds pt woke up suddenly with vomiting. Dr Matilde Sprang notifed

## 2020-01-16 NOTE — Discharge Instructions (Signed)
[name],  Thank you for allowing [place] to be a part of your care!  You were seen and evaluated for your seizures. The EEG showed 3 events that were all nonepileptic. We also ordered a strep test which was negative. This was likely due to an upper respiratory virus. Please resume all your home medications and follow up with your neurologist in 1-2 weeks.  We wish you the best!

## 2020-01-16 NOTE — Progress Notes (Addendum)
HD#0 Subjective:   No acute events overnight.  Patient evaluated at bedside. Mother also at bedside. Patient states she just woke up and does not remember any events overnight. Her mother reports the patient had a brief shaking event in her sleep during which her eyes rolled back in her head, which she states is not typical for her epileptiform seizures. Mother also explains that she has a feeling the patient is coming down with something, notes that a friend of the patient recently had pharyngitis.  The patient states she has had mild cough, nasal congestion, ear fullness over the last day. Has been eating and drinking normally however has not had an appetite this morning. Denies new rashes, dysuria, urinary frequency. Has had some abdominal pain that she believes is related to her menstrual cycle. Denies taking medication for menstrual pain.   Objective:  Vital signs in last 24 hours: Vitals:   01/16/20 0715 01/16/20 0800 01/16/20 0815 01/16/20 1010  BP: 101/73 111/76 106/62 119/73  Pulse: (!) 52 75 (!) 51 78  Resp:  14  18  Temp:      TempSrc:      SpO2: 97% 99% 99% 98%   .Constitutional: tired-appearing woman sitting upright in hospital bed, in no acute distress HENT: normocephalic atraumatic, mucous membranes moist, boggy turbinates, mild pharyngeal erythema and L tonsillar swelling, bilateral tympanic membranes, tympanic membranes non-bulging Eyes: conjunctiva non-erythematous Neck: supple, no cervical lymphadenopathy, mild tenderness over R poster cervical chain lymph nodes Cardiovascular: regular rate and rhythm, no m/r/g Pulmonary/Chest: normal work of breathing on room air, lungs clear to auscultation bilaterally Abdominal: soft, non-tender, non-distended  Pertinent Labs: CBC Latest Ref Rng & Units 01/16/2020 01/15/2020 01/05/2019  WBC 4.0 - 10.5 K/uL 10.7(H) 11.5(H) 8.9  Hemoglobin 12.0 - 15.0 g/dL 12.4 12.9 14.9  Hematocrit 36 - 46 % 39.3 40.4 44.9  Platelets 150 -  400 K/uL 342 351 411(H)    CMP Latest Ref Rng & Units 01/16/2020 01/15/2020 01/09/2019  Glucose 70 - 99 mg/dL 97 104(H) 96  BUN 6 - 20 mg/dL 7 6 8   Creatinine 0.44 - 1.00 mg/dL 0.68 0.69 0.68  Sodium 135 - 145 mmol/L 140 138 139  Potassium 3.5 - 5.1 mmol/L 3.8 3.4(L) 4.3  Chloride 98 - 111 mmol/L 106 105 104  CO2 22 - 32 mmol/L 26 21(L) 26  Calcium 8.9 - 10.3 mg/dL 9.3 9.5 9.6  Total Protein 6.5 - 8.1 g/dL 6.2(L) 7.0 -  Total Bilirubin 0.3 - 1.2 mg/dL 0.7 0.5 -  Alkaline Phos 38 - 126 U/L 59 63 -  AST 15 - 41 U/L 15 16 -  ALT 0 - 44 U/L 13 14 -    Procedures:  Patient Name: Madeline [name]  MRN: [id number]  Epilepsy Attending: Lora Havens  Referring Physician/Provider:  Duration: 01/15/2020 1813 to 01/16/2020 0900  Patient history: 19 year old female with multiple recurrent episodes concerning for seizures.  EEG to evaluate for seizures.  Level of alertness: Awake, asleep  AEDs during EEG study: Lacosamide  Technical aspects: This EEG study was done with scalp electrodes positioned according to the 10-20 International system of electrode placement. Electrical activity was acquired at a sampling rate of 500Hz  and reviewed with a high frequency filter of 70Hz  and a low frequency filter of 1Hz . EEG data were recorded continuously and digitally stored.   Description: The posterior dominant rhythm consists of 8 Hz activity of moderate voltage (25-35 uV) seen predominantly in posterior head regions, asymmetric (  R>L) and reactive to eye opening and eye closing. Sleep was characterized by vertex waves, sleep spindles (12 to 14 Hz), maximal frontocentral region. EEG showed intermittent 5 to 6 Hz theta slowing in right parieto-occipital region.  Hyperventilation and photic stimulation were not performed.     Three events were recorded on 01/16/2020.  During each event patient was noted to have vigorous head shaking from side to side with eyes closed.  During one of the seizures,  patient was noted to switch sides from left lateral to right lateral position.  Also, jerking would stop intermittently and then resume again in a few seconds.  Concomitant EEG, before, during and after the event did not show any EEG changes suggest seizure.  ABNORMALITY -Intermittent slow, right parieto-occipital region -Background asymmetry, right > left  IMPRESSION: This study is suggestive of cortical dysfunction in right parieto- occipital region consistent with underlying craniotomy. Three events recorded as described above without concomitant EEG change and were nonepileptic. No seizures or epileptiform discharges were seen throughout the recording.  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  Seizure with tonic-clonic as well as non-epileptiform characteristics Non-epileptic seizure Cough, nasal congestion, pharyngeal erythema History of epileptic and psychogenic nonepileptic seizures Chronic migraine with aura Overnight, patient reportedly had brief shaking event in her sleep during which her eyes rolled back in her head. Patient monitored with EEG overnight and three events described as vigorous head shaking with eyes closed were captured and did not show any EEG changes suggestive of seizure. Additionally, study was suggestive of cortical dysfunction in the right parieto-occipital region consistent with the patient's underlying craniotomy. There have been no additional episodes of bilateral stiffness and shaking which patient presented for, so unable to say whether the events yesterday were epileptiform or non-epileptiform. On evaluation today, the  patient noted to have mild productive cough, nasal congestion, mild throat discomfort, ear fullness, nasal turbinate boggyness. On admission, Covid, Flu A/B, and RSV negative. Given her recent sick contact with pharyngitis, will obtain respiratory pathogen panel and Group A strep swab. It is possible patient's seizure threshold could have been lowered secondary to this illness. Will follow-up result and if positive, treat accordingly. Patient would be stable for discharge home with PCP and neurology follow-up.  Group A strep negative. Will follow-up RPP. Recommend supportive care and follow-up with PCP and neurology at [facility].   - Respiratory pathogen panel - Group A strep swab - Continue home lacosamide 150 mg twice daily - Lacosamide level pending  GAD Adjustment disorder - Restart home duloxetine 90 mg daily   Diet: Normal IVF: None,None VTE: Enoxaparin Code: Full   Dispo: Anticipated discharge to Home today.  Alexandria Lodge, MD Internal Medicine Resident PGY-1 Pager [phone] Please contact the on call pager after 5 pm and on weekends at [phone].

## 2020-01-16 NOTE — ED Notes (Signed)
Pt d/c home per MD order. Discharge summary reviewed with pt, pt verbalizes understanding. No s/s of acute distress noted at discharge. Off unit via WC. Discharged home with mother.

## 2020-01-16 NOTE — Progress Notes (Signed)
Subjective: Had multiple episodes overnight during which patient was noted to have side to side head movement.  Patient's mother at bedside states patient has been having these episodes on an average once every month.  Usually she tries to speak through them and tries to turn to her side as told by her neurologist.  However the episode yesterday was longer than her previous episodes, she was unable to speak through them and felt more tired afterwards which is why patient was brought to the ED.  ROS: negative except above  Examination  Vital signs in last 24 hours: Temp:  [97.7 F (36.5 C)-98.6 F (37 C)] 97.7 F (36.5 C) (10/28 1115) Pulse Rate:  [51-92] 63 (10/28 1115) Resp:  [12-21] 18 (10/28 1010) BP: (101-123)/(62-79) 109/69 (10/28 1115) SpO2:  [97 %-100 %] 98 % (10/28 1115)  General: lying in bed, not in apparent distress CVS: pulse-normal rate and rhythm RS: breathing comfortably, CTAB Extremities: normal, warm Neuro: Drowsy, opens eyes to verbal stimuli, able to tell me her name, able to answer commands, spontaneously moving all 4 extremities in bed.  Basic Metabolic Panel: Recent Labs  Lab 01/15/20 1651 01/16/20 0629  NA 138 140  K 3.4* 3.8  CL 105 106  CO2 21* 26  GLUCOSE 104* 97  BUN 6 7  CREATININE 0.69 0.68  CALCIUM 9.5 9.3    CBC: Recent Labs  Lab 01/15/20 1651 01/16/20 0629  WBC 11.5* 10.7*  NEUTROABS 8.1*  --   HGB 12.9 12.4  HCT 40.4 39.3  MCV 93.3 94.0  PLT 351 342     Coagulation Studies: No results for input(s): LABPROT, INR in the last 72 hours.  Imaging No new brain imaging overnight.  ASSESSMENT AND PLAN: 19 year old female with past medical history of right parietal ependymoma status post resection presented with seizure-like episodes.  Nonepileptic events Leukocytosis (improving) Hypokalemia (resolved) -Captured events on LTM EEG were nonepileptic, per mom similar to her typical episodes at home.  Recommendations -Continue  Vimpat at home dose -Continue seizure precautions -Continue follow-up with neurology at [facility] -Discussed the diagnosis of nonepileptic events with patient's mother at bedside.  Thank you for allowing [place] to participate in the care of this patient.  Neurology will sign off.   I have spent a total of  35  minutes with the patient reviewing hospital notes,  test results, labs and examining the patient as well as establishing an assessment and plan that was discussed personally with the patient's mother at bedside.  > 50% of time was spent in direct patient care.    Zeb Comfort Epilepsy [facility] For questions after 5pm please refer to AMION to reach the Neurologist on call

## 2020-01-17 NOTE — Discharge Summary (Signed)
Name: Madeline [name] MRN: [id number] DOB: Aug 11, 2000 19 y.o. PCP: [facility] [place]  Date of Admission: 01/15/2020  4:27 PM Date of Discharge: 01/16/2020 Attending Physician: Dr. Gilles Chiquito  Discharge Diagnosis: 1. Seizure  Discharge Medications: Allergies as of 01/16/2020      Reactions   Ibuprofen Other (See Comments)   Patient was weaned off this by MD   Keppra [levetiracetam] Other (See Comments)   Patient cannot function if taking this, per mother ("drops her I.Q.")   Penicillins Rash   Did it involve swelling of the face/tongue/throat, SOB, or low BP? Yes Did it involve sudden or severe rash/hives, skin peeling, or any reaction on the inside of your mouth or nose? Yes Did you need to seek medical attention at a hospital or doctor's office? Yes When did it last happen? "She was 10 years old," per mother If all above answers are "NO", may proceed with cephalosporin use.      Medication List    TAKE these medications   baclofen 10 MG tablet Commonly known as: LIORESAL Take 10 mg by mouth 3 (three) times daily.   DULoxetine 60 MG capsule Commonly known as: CYMBALTA Take 60 mg by mouth daily. Take along with 30 mg=90 mg   DULoxetine 30 MG capsule Commonly known as: CYMBALTA Take 30 mg by mouth daily. Take along with 60 mg=90 mg   hydrOXYzine 10 MG tablet Commonly known as: ATARAX/VISTARIL Take 10 mg by mouth 3 (three) times daily as needed for anxiety.   Lacosamide 100 MG Tabs Take 1 tablet (100 mg total) by mouth 2 (two) times daily.   Vimpat 150 MG Tabs Generic drug: Lacosamide Take 1 tablet by mouth 2 (two) times daily.   memantine 10 MG tablet Commonly known as: NAMENDA Take 10 mg by mouth 2 (two) times daily.   ondansetron 4 MG tablet Commonly known as: ZOFRAN Take 4 mg by mouth every 8 (eight) hours as needed for nausea or vomiting.       Disposition and follow-up:   Madeline [name] was discharged from [facility]  in Good condition.  At the hospital follow up visit please address:  1.    Seizure: events captured overnight were non-epileptiform. It is possible patient had an epileptiform seizure prior to admission secondary to viral illness. Recommend follow-up with neurologist.  2.  Labs / imaging needed at time of follow-up: none  3.  Pending labs/ test needing follow-up: 01/15/20 lacosamide level  Follow-up Appointments:   Recommended follow-up with PCP and neurologist.   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  Seizure with tonic-clonic as well as non-epileptiform characteristics Non-epileptic seizure Cough, nasal congestion, pharyngeal erythema History of epileptic and psychogenic nonepileptic seizures Chronic migraine with aura [name] was admitted after a reported episode of R shoulder and head twitching followed by a period of unresponsiveness during which her eyes rolled back in her head and had bilateral stiffness and shaking which lasted 8 minutes. Had postictal state afterwards. EMS gave Versed 5mg  and ED gave Ativan 1mg . Had several episodes of R head and shoulder jerking in the ED but no further tonic-clonic activity reported. Had normal cognition and neuro exam at time of admission interview. Neurology consulted and ordered EEG. Patient was continued on her home lacosamide  150 mg twice daily. Overnight, patient reportedly had brief shaking event in her sleep during which her eyes rolled back in her head. Patient monitored with EEG overnight and three events described as vigorous head shaking with eyes closed were captured and did not show any EEG changes suggestive of  seizure. Additionally, study was suggestive of cortical dysfunction in the right parieto-occipital region consistent with the patient's underlying craniotomy. There were no additional episodes of bilateral stiffness and shaking which patient presented for, so unable to say whether the events yesterday were epileptiform or non-epileptiform. On evaluation the Madeline of discharge, noted to have mild productive cough, nasal congestion, mild throat discomfort, ear fullness, nasal turbinate boggyness. On admission, Covid, Flu A/B, and RSV negative. Given her recent sick contact with pharyngitis, Group A strep swab was obtained and negative. Suspect patient's seizure threshold could have been lowered secondary a viral respiratory illness. Patient was discharged home on her home medications with PCP and neurology follow-up.  Discharge Vitals:   BP 109/69   Pulse 63   Temp 97.7 F (36.5 C) (Oral)   Resp 18   SpO2 98%   Pertinent Labs, Studies, and Procedures:   Overnight EEG  Patient Name:Madeline [name] WUJ:[id number] Epilepsy Attending:Priyanka [name] Referring Physician/Provider: Duration:01/15/2020 1813 to 01/16/2020 0900  Patient history:19 year old female with multiple recurrent episodes concerning for seizures. EEG to evaluate for seizures.  Level of alertness:Awake, asleep  AEDs during EEG study:Lacosamide  Technical aspects: This EEG study was done with scalp electrodes positioned according to the 10-20 International system of electrode placement. Electrical activity was acquired at a sampling rate of 500Hz  and reviewed with a high frequency filter of 70Hz  and a low frequency filter of 1Hz . EEG data were recorded continuously and digitally stored.   Description: The posterior dominant rhythm consists of8Hz  activity of moderate voltage (25-35 uV) seen predominantly in posterior head regions, asymmetric( R>L)and reactive to eye opening and eye closing. Sleep was  characterized by vertex waves, sleep spindles (12 to 14 Hz), maximal frontocentral region. EEG showed intermittent 5 to 6 Hz theta slowing in right parieto-occipital region.Hyperventilation and photic stimulation were not performed.   Threeevents were recorded on 01/16/2020.During each event patient was noted to have vigorous head shaking from side to side with eyes closed.During one of the seizures, patient wasnoted to switch sides from left lateral to right lateral position.Also, jerking would stop intermittently and then resume again in a few seconds. Concomitant EEG, before, during and after the event did not show any EEG changes suggest seizure.  ABNORMALITY -Intermittent slow, right parieto-occipital region -Background asymmetry, right> left  IMPRESSION: This study issuggestive of cortical dysfunction in rightparieto-occipital region consistent with underlying craniotomy. Threeevents recorded as described above without concomitant EEG change and were nonepileptic. No seizures or epileptiform discharges were seen throughout the recording.  Discharge Instructions: Discharge Instructions    Call MD for:  difficulty breathing, headache or visual disturbances   Complete by: As directed    Call MD for:  persistant dizziness or light-headedness   Complete by: As directed    Call MD for:  persistant nausea and vomiting   Complete by: As directed    Call MD for:  temperature >100.4   Complete by: As directed    Increase activity slowly   Complete by: As directed      Ms. Erno,  Thank you for allowing [place] to be a part of your care!  You were seen and evaluated for your seizures. The EEG showed  3 events that were all nonepileptic. We also ordered a strep test which was negative. This was likely due to an upper respiratory virus. Please resume all your home medications and follow up with your neurologist in 1-2 weeks.  We wish you the best!   Signed: Alexandria Lodge,  MD 01/17/2020, 8:26 AM   Pager: [phone]

## 2020-01-19 LAB — LACOSAMIDE: Lacosamide: <0.5 ug/mL — ABNORMAL LOW (ref 5.0–10.0)

## 2020-07-13 ENCOUNTER — Telehealth (HOSPITAL_COMMUNITY): Payer: Self-pay

## 2020-07-13 NOTE — Telephone Encounter (Signed)
Called to discuss with patient about COVID-19 symptoms and the use of one of the available treatments for those with mild to moderate Covid symptoms and at a high risk of hospitalization.  Pt appears to qualify for outpatient treatment due to co-morbid conditions and/or a member of an at-risk group in accordance with the FDA Emergency Use Authorization.    Symptom onset: 4/24 Vaccinated: unknown Booster: unknown Immunocompromised: unknown Qualifiers: seizures NIH Criteria: unknown  Unable to reach pt - voice mail left  Janine Ores, RN

## 2020-07-15 ENCOUNTER — Encounter: Payer: Self-pay | Admitting: Nurse Practitioner

## 2020-07-15 ENCOUNTER — Telehealth: Payer: Medicaid Other | Admitting: Nurse Practitioner

## 2020-07-15 DIAGNOSIS — U071 COVID-19: Secondary | ICD-10-CM

## 2020-07-15 DIAGNOSIS — H9203 Otalgia, bilateral: Secondary | ICD-10-CM | POA: Diagnosis not present

## 2020-07-15 MED ORDER — CHLORPHEN-PE-ACETAMINOPHEN 4-10-325 MG PO TABS: 1.0000 | ORAL_TABLET | Freq: Four times a day (QID) | ORAL | 0 refills | Status: DC | PRN

## 2020-07-15 MED ORDER — AZITHROMYCIN 250 MG PO TABS: ORAL_TABLET | ORAL | 0 refills | Status: DC

## 2020-07-15 MED ORDER — FLUTICASONE PROPIONATE 50 MCG/ACT NA SUSP: 2.0000 | Freq: Every day | NASAL | 6 refills | Status: AC

## 2020-07-15 NOTE — Progress Notes (Signed)
[name], Madeline [name] are scheduled for a virtual visit with your provider today.    Just as we do with appointments in the office, we must obtain your consent to participate.  Your consent will be active for this visit and any virtual visit you may have with one of our providers in the next 365 days.    If you have a MyChart account, I can also send a copy of this consent to you electronically.  All virtual visits are billed to your insurance company just like a traditional visit in the office.  As this is a virtual visit, video technology does not allow for your provider to perform a traditional examination.  This may limit your provider's ability to fully assess your condition.  If your provider identifies any concerns that need to be evaluated in person or the need to arrange testing such as labs, EKG, etc, we will make arrangements to do so.    Although advances in technology are sophisticated, we cannot ensure that it will always work on either your end or our end.  If the connection with a video visit is poor, we may have to switch to a telephone visit.  With either a video or telephone visit, we are not always able to ensure that we have a secure connection.   I need to obtain your verbal consent now.   Are you willing to proceed with your visit today?   Madeline [name] has provided verbal consent on 07/15/2020 for a virtual visit (video or telephone).  Virtual Visit via Video  I connected with  Madeline [name]  on 07/15/20 at 2:16 by video and verified that I am speaking with the correct person using two identifiers. Madeline [name] is currently located at home and her mom is currently with her during visit. The provider, Mary-Margaret Hassell Done, FNP is located at home at time of visit.  I discussed the limitations, risks, security and privacy concerns of performing an evaluation and management service by video  and the availability of in person appointments. I also discussed with the patient that there may be a  patient responsible charge related to this service. The patient expressed understanding and agreed to proceed.   Subjective:   HPI:   Patient needs visit to discuss ear pain. She tested positive for covid on Sunday of last week. She has just been taking OTC meds as needed. She says she thinks her fever broke last night. Her main complaint right now is bil ear pain. She is susceptible to ear infections. Her mom also says she has brain cancer and her immune system is down as well.   ROS:  Review of Systems  Constitutional: Positive for chills and fever.  HENT: Positive for ear pain. Negative for congestion and ear discharge.   Respiratory: Positive for cough.   Musculoskeletal: Positive for myalgias.  Neurological: Positive for headaches. Negative for dizziness.  Psychiatric/Behavioral: Negative.     See pertinent positives and negatives per HPI.  Patient Active Problem List   Diagnosis Date Noted  . Seizure ([place]) 01/15/2020  . Seizure-like activity ([place]) 01/06/2019    Social History   Tobacco Use  . Smoking status: Never Smoker  . Smokeless tobacco: Not on file  Substance Use Topics  . Alcohol use: Never    Current Outpatient Medications:  .  baclofen (LIORESAL) 10 MG tablet, Take 10 mg by mouth 3 (three) times daily., Disp: , Rfl:  .  DULoxetine (CYMBALTA) 30 MG capsule, Take 30 mg  by mouth daily. Take along with 60 mg=90 mg, Disp: , Rfl:  .  DULoxetine (CYMBALTA) 60 MG capsule, Take 60 mg by mouth daily. Take along with 30 mg=90 mg, Disp: , Rfl:  .  hydrOXYzine (ATARAX/VISTARIL) 10 MG tablet, Take 10 mg by mouth 3 (three) times daily as needed for anxiety. , Disp: , Rfl:  .  lacosamide 100 MG TABS, Take 1 tablet (100 mg total) by mouth 2 (two) times daily. (Patient not taking: Reported on 01/15/2020), Disp: 60 tablet, Rfl: 0 .  memantine (NAMENDA) 10 MG tablet, Take 10 mg by mouth 2 (two) times daily. , Disp: , Rfl:  .  ondansetron (ZOFRAN) 4 MG tablet, Take 4 mg by mouth  every 8 (eight) hours as needed for nausea or vomiting. , Disp: , Rfl:  .  VIMPAT 150 MG TABS, Take 1 tablet by mouth 2 (two) times daily., Disp: , Rfl:   Allergies  Allergen Reactions  . Ibuprofen Other (See Comments)    Patient was weaned off this by MD  . Keppra [Levetiracetam] Other (See Comments)    Patient cannot function if taking this, per mother ("drops her I.Q.")  . Penicillins Rash    Did it involve swelling of the face/tongue/throat, SOB, or low BP? Yes Did it involve sudden or severe rash/hives, skin peeling, or any reaction on the inside of your mouth or nose? Yes Did you need to seek medical attention at a hospital or doctor's office? Yes When did it last happen? "She was 10 years old," per mother If all above answers are "NO", may proceed with cephalosporin use.     Objective:   There were no vitals taken for this visit.  Patient is well-developed, well-nourished in no acute distress.  Resting comfortably on couch at home.  Head is normocephalic, atraumatic.  No labored breathing.  Speech is clear and coherent with logical content.  Patient is alert and oriented at baseline.  Unable to visualize TM or ear canals  Assessment and Plan:        Madeline [name] in today with chief complaint of No chief complaint on file.   1. Otalgia of both ears - azithromycin (ZITHROMAX Z-PAK) 250 MG tablet; As directed  Dispense: 6 tablet; Refill: 0  2. COVID-19 virus RNA test result positive at limit of detection 1. Take meds as prescribed 2. Use a cool mist humidifier especially during the winter months and when heat has been humid. 3. Use saline nose sprays frequently 4. Saline irrigations of the nose can be very helpful if done frequently.  * 4X daily for 1 week*  * Use of a nettie pot can be helpful with this. Follow directions with this* 5. Drink plenty of fluids 6. Keep thermostat turn down low 7.For any cough or congestion  Use plain Mucinex- regular strength or max  strength is fine   * Children- consult with Pharmacist for dosing 8. For fever or aces or pains- take tylenol or ibuprofen appropriate for age and weight.  * for fevers greater than 101 orally you may alternate ibuprofen and tylenol every  3 hours.    - Chlorphen-PE-Acetaminophen 4-10-325 MG TABS; Take 1 tablet by mouth every 6 (six) hours as needed.  Dispense: 20 tablet; Refill: 0 - fluticasone (FLONASE) 50 MCG/ACT nasal spray; Place 2 sprays into both nostrils daily.  Dispense: 16 g; Refill: 6    The above assessment and management plan was discussed with the patient. The patient verbalized understanding of and  has agreed to the management plan. Patient is aware to call the clinic if symptoms persist or worsen. Patient is aware when to return to the clinic for a follow-up visit. Patient educated on when it is appropriate to go to the emergency department.     [place], FNP 07/15/2020  Time spent with the patient: 12 minutes, of which >50% was spent in obtaining information about symptoms, reviewing previous labs, evaluations, and treatments, counseling about condition (please see the discussed topics above), and developing a plan to further investigate it; had a number of questions which I addressed.

## 2020-07-21 ENCOUNTER — Telehealth: Payer: Medicaid Other | Admitting: Physician Assistant

## 2020-07-21 ENCOUNTER — Encounter: Payer: Self-pay | Admitting: Physician Assistant

## 2020-07-21 DIAGNOSIS — Z9189 Other specified personal risk factors, not elsewhere classified: Secondary | ICD-10-CM

## 2020-07-21 DIAGNOSIS — U071 COVID-19: Secondary | ICD-10-CM

## 2020-07-21 NOTE — Progress Notes (Signed)
Madeline [name], Madeline [name] are scheduled for a virtual visit with your provider today.    Just as we do with appointments in the office, we must obtain your consent to participate.  Your consent will be active for this visit and any virtual visit you may have with one of our providers in the next 365 days.    If you have a MyChart account, I can also send a copy of this consent to you electronically.  All virtual visits are billed to your insurance company just like a traditional visit in the office.  As this is a virtual visit, video technology does not allow for your provider to perform a traditional examination.  This may limit your provider's ability to fully assess your condition.  If your provider identifies any concerns that need to be evaluated in person or the need to arrange testing such as labs, EKG, etc, we will make arrangements to do so.    Although advances in technology are sophisticated, we cannot ensure that it will always work on either your end or our end.  If the connection with a video visit is poor, we may have to switch to a telephone visit.  With either a video or telephone visit, we are not always able to ensure that we have a secure connection.   I need to obtain your verbal consent now.   Are you willing to proceed with your visit today?   Madeline [name] has provided verbal consent on 07/21/2020 for a virtual visit (video or telephone).   Madeline Soho Jamael Hoffmann, PA-C 07/21/2020  1:32 PM   Date:  07/21/2020   ID:  Gerhard Munch, DOB 2000-12-19, MRN [id number]  Patient Location: Home Provider Location: Home Office   Participants: Patient and Provider for Visit and Wrap up  Method of visit: Video  Location of Patient: Home Location of Provider: Home Office Consent was obtain for visit over the video. Services rendered by provider: Visit was performed via video  A video enabled telemedicine application was used and I verified that I am speaking with the correct person using two  identifiers.  PCP:  [facility]   Chief Complaint:  COVID +  History of Present Illness:    Madeline [name] is a 20 y.o. female with history as stated below. Presents video telehealth for an acute care visit.  Pt reports she tested + for COVID on 4/24.  Fully vaccinated for COVID.    She is traveling to [place] on 07/24/2020 and is taking the PCR tomorrow, but she also needs a letter stating that she was positive.    Pt reports she is feeling much better.  She reports a small sore throat and congestion.  No SOB, chest pain, palpitations.  The patient does have symptoms concerning for COVID-19 infection (fever, chills, cough, or new shortness of breath).  Patient has been tested for COVID during this illness - positive on 4/24.  Past Medical, Surgical, Social History, Allergies, and Medications have been Reviewed.  Patient Active Problem List   Diagnosis Date Noted  . Seizure ([place]) 01/15/2020  . Head trauma, initial encounter 07/27/2019  . 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 Abnormal EEG 02/25/2019  . 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 Seizure-like activity ([place]) 01/06/2019  . Chronic migraine with aura 05/24/2018  . Mood and affect disturbance 05/24/2018  . Persistent insomnia 05/24/2018  . 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 Smoking  status: Never Smoker  . Smokeless tobacco: Not on file  Substance Use Topics  . Alcohol use: Never     Current Outpatient Medications:  .  cyclobenzaprine (FLEXERIL) 10 MG tablet, Take 1 tablet by mouth as needed., Disp: , Rfl:  .  Drospirenone 4 MG TABS, Take 1 tablet by mouth daily., Disp: , Rfl:  .  magnesium oxide (MAG-OX) 400 MG tablet, Take 1 tablet by mouth in the morning and at bedtime., Disp: , Rfl:  .  SUMAtriptan (IMITREX) 50 MG tablet, Take 1 tablet by mouth as needed. May repeat in 2 hours if migraine continues, Disp:  , Rfl:  .  ARIPiprazole (ABILIFY) 2 MG tablet, Take 2 mg by mouth daily., Disp: , Rfl:  .  azithromycin (ZITHROMAX Z-PAK) 250 MG tablet, As directed, Disp: 6 tablet, Rfl: 0 .  baclofen (LIORESAL) 10 MG tablet, Take 10 mg by mouth 3 (three) times daily., Disp: , Rfl:  .  Calcium Carb-Magnesium Carb (MAGNEBIND 400) 80-115 MG TABS, Take 1 tablet by mouth daily., Disp: , Rfl:  .  Chlorphen-PE-Acetaminophen 4-10-325 MG TABS, Take 1 tablet by mouth every 6 (six) hours as needed., Disp: 20 tablet, Rfl: 0 .  cyanocobalamin 1000 MCG tablet, Take 1,000 mcg by mouth., Disp: , Rfl:  .  DULoxetine (CYMBALTA) 30 MG capsule, Take 30 mg by mouth daily. Take along with 60 mg=90 mg, Disp: , Rfl:  .  DULoxetine (CYMBALTA) 60 MG capsule, Take 60 mg by mouth daily. Take along with 30 mg=90 mg, Disp: , Rfl:  .  fluticasone (FLONASE) 50 MCG/ACT nasal spray, Place 2 sprays into both nostrils daily., Disp: 16 g, Rfl: 6 .  hydrOXYzine (ATARAX/VISTARIL) 10 MG tablet, Take 10 mg by mouth 3 (three) times daily as needed for anxiety. , Disp: , Rfl:  .  lacosamide 100 MG TABS, Take 1 tablet (100 mg total) by mouth 2 (two) times daily. (Patient not taking: Reported on 01/15/2020), Disp: 60 tablet, Rfl: 0 .  memantine (NAMENDA) 10 MG tablet, Take 10 mg by mouth 2 (two) times daily. , Disp: , Rfl:  .  ondansetron (ZOFRAN) 4 MG tablet, Take 4 mg by mouth every 8 (eight) hours as needed for nausea or vomiting. , Disp: , Rfl:  .  VIMPAT 150 MG TABS, Take 1 tablet by mouth 2 (two) times daily., Disp: , Rfl:    Allergies  Allergen Reactions  . Ibuprofen Other (See Comments)    Patient was weaned off this by MD  . Keppra [Levetiracetam] Other (See Comments)    Patient cannot function if taking this, per mother ("drops her I.Q.")  . Penicillins Rash    Did it involve swelling of the face/tongue/throat, SOB, or low BP? Yes Did it involve sudden or severe rash/hives, skin peeling, or any reaction on the inside of your mouth or nose?  Yes Did you need to seek medical attention at a hospital or doctor's office? Yes When did it last happen? "She was 15 years old," per mother If all above answers are "NO", may proceed with cephalosporin use.      Review of Systems  Constitutional: Negative for chills and fever.  HENT: Positive for congestion and sore throat. Negative for ear pain.   Eyes: Negative for blurred vision and double vision.  Respiratory: Negative for cough, shortness of breath and wheezing.   Cardiovascular: Negative for chest pain, palpitations and leg swelling.  Gastrointestinal: Negative for abdominal pain, diarrhea, nausea and vomiting.  Genitourinary: Negative for  dysuria.  Musculoskeletal: Negative for myalgias.  Skin: Negative for rash.  Neurological: Negative for loss of consciousness, weakness and headaches.  Psychiatric/Behavioral: The patient is not nervous/anxious.    See HPI for history of present illness.  Physical Exam Constitutional:      General: She is not in acute distress.    Appearance: Normal appearance. She is not ill-appearing.  HENT:     Head: Normocephalic and atraumatic.     Nose: Congestion present.  Eyes:     Extraocular Movements: Extraocular movements intact.  Pulmonary:     Effort: Pulmonary effort is normal.     Comments: No increased work of breathing or accessory muscle usage; speaks in full sentences. Musculoskeletal:        General: Normal range of motion.     Cervical back: Normal range of motion.  Skin:    Coloration: Skin is not pale.  Neurological:     General: No focal deficit present.     Mental Status: She is alert. Mental status is at baseline.  Psychiatric:        Mood and Affect: Mood normal.               A&P  1. COVID-19 virus RNA test result positive at limit of detection  - positive on 4/24  - feeling much better  - needs a note stating that she was COVID + before leaving the country  2. At high risk for pulmonary embolism  - recent  COVID 19 and upcoming international airline travel  - discussed increased risk, use of compression stockings, staying active and symptoms to be aware of   Patient voiced understanding and agreement to plan.   Time:   Today, I have spent 15 minutes with the patient with telehealth technology discussing the above problems, reviewing the chart, previous notes, medications and orders.    Tests Ordered: No orders of the defined types were placed in this encounter.   Medication Changes: No orders of the defined types were placed in this encounter.    Disposition:  Follow up PCP or ER as needed.  Celso Sickle, PA-C  07/21/2020 1:43 PM

## 2020-07-21 NOTE — Patient Instructions (Signed)
1. COVID-19 virus RNA test result positive at limit of detection  - positive on 4/24  - feeling much better  - needs a note stating that she was COVID + before leaving the country  2. At high risk for pulmonary embolism  - recent COVID 19 and upcoming international airline travel  - discussed increased risk, use of compression stockings, staying active and symptoms to be aware of

## 2020-11-16 ENCOUNTER — Emergency Department (HOSPITAL_COMMUNITY): Payer: Medicaid Other

## 2020-11-16 ENCOUNTER — Emergency Department (HOSPITAL_COMMUNITY)
Admission: EM | Admit: 2020-11-16 | Discharge: 2020-11-17 | Disposition: A | Discharge status: Home / Self Care | Payer: Medicaid Other | Attending: Emergency Medicine | Admitting: Emergency Medicine

## 2020-11-16 ENCOUNTER — Other Ambulatory Visit: Payer: Self-pay

## 2020-11-16 DIAGNOSIS — R569 Unspecified convulsions: Secondary | ICD-10-CM | POA: Diagnosis not present

## 2020-11-16 DIAGNOSIS — Z85841 Personal history of malignant neoplasm of brain: Secondary | ICD-10-CM | POA: Diagnosis not present

## 2020-11-16 DIAGNOSIS — D496 Neoplasm of unspecified behavior of brain: Secondary | ICD-10-CM

## 2020-11-16 LAB — CBC WITH DIFFERENTIAL/PLATELET
Abs Immature Granulocytes: 0.04 10*3/uL (ref 0.00–0.07)
Basophils Absolute: 0.1 10*3/uL (ref 0.0–0.1)
Basophils Relative: 1 %
Eosinophils Absolute: 0.3 10*3/uL (ref 0.0–0.5)
Eosinophils Relative: 2 %
HCT: 42.3 % (ref 36.0–46.0)
Hemoglobin: 13.4 g/dL (ref 12.0–15.0)
Immature Granulocytes: 0 %
Lymphocytes Relative: 25 %
Lymphs Abs: 2.8 10*3/uL (ref 0.7–4.0)
MCH: 29.8 pg (ref 26.0–34.0)
MCHC: 31.7 g/dL (ref 30.0–36.0)
MCV: 94.2 fL (ref 80.0–100.0)
Monocytes Absolute: 0.9 10*3/uL (ref 0.1–1.0)
Monocytes Relative: 8 %
Neutro Abs: 7.5 10*3/uL (ref 1.7–7.7)
Neutrophils Relative %: 64 %
Platelets: 373 10*3/uL (ref 150–400)
RBC: 4.49 MIL/uL (ref 3.87–5.11)
RDW: 12.6 % (ref 11.5–15.5)
WBC: 11.6 10*3/uL — ABNORMAL HIGH (ref 4.0–10.5)
nRBC: 0 % (ref 0.0–0.2)

## 2020-11-16 LAB — I-STAT BETA HCG BLOOD, ED (MC, WL, AP ONLY): I-stat hCG, quantitative: <5 m[IU]/mL (ref <5)

## 2020-11-16 LAB — COMPREHENSIVE METABOLIC PANEL
ALT: 14 U/L (ref 0–44)
AST: 18 U/L (ref 15–41)
Albumin: 3.9 g/dL (ref 3.5–5.0)
Alkaline Phosphatase: 75 U/L (ref 38–126)
Anion gap: 8 (ref 5–15)
BUN: 6 mg/dL (ref 6–20)
CO2: 24 mmol/L (ref 22–32)
Calcium: 9.1 mg/dL (ref 8.9–10.3)
Chloride: 106 mmol/L (ref 98–111)
Creatinine, Ser: 0.78 mg/dL (ref 0.44–1.00)
GFR, Estimated: >60 mL/min (ref >60)
Glucose, Bld: 87 mg/dL (ref 70–99)
Potassium: 3.8 mmol/L (ref 3.5–5.1)
Sodium: 138 mmol/L (ref 135–145)
Total Bilirubin: 0.5 mg/dL (ref 0.3–1.2)
Total Protein: 6.8 g/dL (ref 6.5–8.1)

## 2020-11-16 MED ORDER — LORAZEPAM 2 MG/ML IJ SOLN
INTRAMUSCULAR | Status: AC
  Administered 2020-11-16: 2 mg
  Filled 2020-11-16: qty 1

## 2020-11-16 MED ORDER — GADOBUTROL 1 MMOL/ML IV SOLN
7.0000 mL | Freq: Once | INTRAVENOUS | Status: AC | PRN
  Administered 2020-11-16: 7 mL via INTRAVENOUS

## 2020-11-16 MED ORDER — SODIUM CHLORIDE 0.9 % IV BOLUS
1000.0000 mL | Freq: Once | INTRAVENOUS | Status: AC
  Administered 2020-11-16: 1000 mL via INTRAVENOUS

## 2020-11-16 MED ORDER — SODIUM CHLORIDE 0.9 % IV BOLUS
1000.0000 mL | Freq: Once | INTRAVENOUS | Status: AC
Start: 2020-11-16 — End: 2020-11-16
  Administered 2020-11-16: 1000 mL via INTRAVENOUS

## 2020-11-16 NOTE — ED Notes (Signed)
Visualized one minute of seizure activity with twitching noted in bilateral arms lasting about a minute. H[place], [place] observed same and verbalized 2 mg Ativan order. Ativan given and seizing stopped. Pt placed prophylactically on 2 L [place] O2.

## 2020-11-16 NOTE — ED Triage Notes (Signed)
Hx of brain tumor and seizures. Pt was in class at [employer] when she had a seizure episode. Another witnessed seizure by roommate in dorm room approximately 10-6mns. No seizure episode in 9 months. 22g RH. EMS gave 2.'5mg'$  Versed. Pt is able to answer yes and no questions at this time as pt is very weak and tired. 450cc NS bolus given by EMS. Blood sugar was 76.

## 2020-11-16 NOTE — ED Notes (Signed)
Patient transported to MRI 

## 2020-11-16 NOTE — ED Provider Notes (Signed)
[facility] EMERGENCY DEPARTMENT Provider Note   CSN: [id number] Arrival date & time: 11/16/20  1634     History No chief complaint on file.   Madeline [name] is a 20 y.o. female.  HPI  Patient with history of premalignancy, psychogenic nonepileptic seizures, adjustment disorder presents with seizure.  She had 1 unwitnessed seizure and 1 focal right-sided seizure with EMS today.  The first 1 happened in school, it was not associated with any urinary incontinence.  The second 1 happened with EMS and lasted for over 10 minutes.  Patient has history of the same, most recent being in October.  She does not take Keppra as preventative medicine, denies any recent changes in her medications.  States that her usual known trigger is light.  Denies any illicit drug use.  Collateral history obtained from patient's mother: Patient takes Vimpat, Cymbalta, and Abilify.  Missed a dose of each last week.  History of nonepileptic and epileptic seizures, last was in October.  Triggers include but are not limited to flashing lights, lack of sleep, underlying illness.  COPD therapy has been helping.  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      Family History  Problem Relation Age of Onset   Stroke Mother    Heart attack Maternal Grandfather    Breast cancer Paternal Grandmother     Social History   Tobacco Use   Smoking status: Never  Substance Use Topics   Alcohol use: Never   Drug use: Yes    Types: Marijuana    Comment: medical marijuana, [place] resident here for school, uses rarely for headaches only    Home Medications Prior to Admission medications   Medication Sig Start Date End Date Taking? Authorizing Provider  ARIPiprazole (ABILIFY) 2 MG tablet Take 2 mg by mouth daily. 05/02/20   [provider]  azithromycin (ZITHROMAX Z-PAK) 250 MG tablet As directed 07/15/20   Hassell Done, Mary-Margaret, FNP  baclofen (LIORESAL) 10 MG tablet Take 10 mg by mouth 3 (three) times daily. 11/29/19   [provider]  Calcium Carb-Magnesium Carb (MAGNEBIND 400) 80-115 MG TABS Take 1 tablet by mouth daily.    [provider]  Chlorphen-PE-Acetaminophen 4-10-325 MG TABS Take 1 tablet by mouth every 6 (six) hours as needed. 07/15/20   Hassell Done Mary-Margaret, FNP  cyanocobalamin 1000 MCG tablet Take 1,000 mcg by mouth.    [provider]  cyclobenzaprine (FLEXERIL) 10 MG tablet Take 1 tablet by mouth as needed. 07/03/19   [provider]  Drospirenone 4 MG TABS Take 1 tablet by mouth daily. 03/27/18   [provider]  DULoxetine (CYMBALTA) 30 MG capsule Take 30 mg by mouth  daily. Take along with 60 mg=90 mg 11/01/19   [provider]  DULoxetine (CYMBALTA) 60 MG capsule Take 60 mg by mouth daily. Take along with 30 mg=90 mg    [provider]  fluticasone (FLONASE) 50 MCG/ACT nasal spray Place 2 sprays into both nostrils daily. 07/15/20   Hassell Done Mary-Margaret, FNP  hydrOXYzine (ATARAX/VISTARIL) 10 MG tablet Take 10 mg by mouth 3 (three) times daily as needed for anxiety.  08/26/19   [provider]  lacosamide 100 MG TABS Take 1 tablet (100 mg total) by mouth 2 (two)  times daily. Patient not taking: Reported on 01/15/2020 01/09/19   [name], MD  magnesium oxide (MAG-OX) 400 MG tablet Take 1 tablet by mouth in the morning and at bedtime. 11/21/18   [provider]  memantine (NAMENDA) 10 MG tablet Take 10 mg by mouth 2 (two) times daily.  11/06/17 01/15/20  [provider]  ondansetron (ZOFRAN) 4 MG tablet Take 4 mg by mouth every 8 (eight) hours as needed for nausea or vomiting.  12/21/17   [provider]  SUMAtriptan (IMITREX) 50 MG tablet Take 1 tablet by mouth as needed. May repeat in 2 hours if migraine continues 07/11/19   [provider]  VIMPAT 150 MG TABS Take 1 tablet by mouth 2 (two) times daily. 10/10/19   [provider]    Allergies    Ibuprofen, Keppra [levetiracetam], and Penicillins  Review of Systems   Review of Systems  Constitutional:  Negative for appetite change and fever.  Eyes:  Negative for photophobia.  Gastrointestinal:  Negative for nausea and vomiting.  Neurological:  Positive for syncope and weakness. Negative for seizures, facial asymmetry and headaches.   Physical Exam Updated Vital Signs There were no vitals taken for this visit.  Physical Exam Vitals and nursing note reviewed. Exam conducted with a chaperone present.  Constitutional:      General: She is not in acute distress.    Appearance: Normal appearance.     Comments: Patient is obviously tired and slow, postictal state.  Answers yes/no questions, delayed speech.  HENT:     Head: Normocephalic and atraumatic.     Mouth/Throat:     Comments: Tongue atraumatic Eyes:     General: No scleral icterus.    Extraocular Movements: Extraocular movements intact.     Pupils: Pupils are equal, round, and reactive to light.  Cardiovascular:     Rate and Rhythm: Normal rate and regular rhythm.     Pulses: Normal pulses.     Heart sounds: Normal heart sounds.  Abdominal:     General: Abdomen is flat.  Skin:     Coloration: Skin is not jaundiced.  Neurological:     Mental Status: She is alert. Mental status is at baseline.     Coordination: Coordination normal.     Comments: Cranial nerves III through XII are grossly intact.  Grip strength is equal bilaterally.   ED Results / Procedures / Treatments   Labs (all labs ordered are listed, but only abnormal results are displayed) Labs Reviewed - No data to display  EKG None  Radiology No results found.  Procedures Procedures   Medications Ordered in ED Medications - No data to display  ED Course  I have reviewed the triage vital signs and the nursing notes.  Pertinent labs & imaging results that were available during my care of the patient were reviewed by me and considered in my medical decision making (see  chart for details).  Clinical Course as of 11/16/20 2342  Mon Nov 16, 2020  1747 Patient having any clonic type seizure while in the room, resolved with Ativan.  Lasted roughly 2 minutes.  Patient is currently postictal. [HS]  1819 Comprehensive metabolic panel No electrolyte derangement [HS]  1819 I-Stat beta hCG blood, ED No ectopic [HS]  1819 CBC with Differential(!) Mild leukocytosis, no anemia, no left shift [HS]  2135 Patient has had multiple short acting pseudoseizures while in the ED.  She has been conscious during these episodes and responsive.  CT head was unremarkable, will order MRI to evaluate  [HS]    Clinical Course User Index [HS] Sherrill Raring, PA-C   MDM Rules/Calculators/A&P                           Patient vitals are stable, she has nontoxic-appearing.  She has history of anaplastic ependymoma, pseudoseizures, epileptic seizures as well as adjustment disorder.  She has been seeing a therapist once every 3 weeks which seems to have helped improve the frequency of her seizures.  Today's episodes are different and that they are longer lasting and more frequent in number.  Have been alleviated with Ativan thus  far, will give fluids and work-up for underlying infection or possible causes such as electrolyte derangement.  There is a question of pseudoseizure versus seizure.  Please see ED course for interpretation of labs and imaging as they pertain to the differential.  Patient reports not feeling somewhat improved, but she is still also appears postictal.  She has been having frequent seizure-like activity throughout the ED stay.  MRI is still pending at the time of discharge, disposition will follow results.  I suspect she will likely be appropriate for discharge home with outpatient follow-up with neurology if MRI results negative for acute process and if her seizure-like activity resolves.  Patient care is signed out to Quincy Carnes, PA-C please see her note for disposition.  Final Clinical Impression(s) / ED Diagnoses Final diagnoses:  None    Rx / DC Orders ED Discharge Orders     None        Sherrill Raring, Hershal Coria 11/16/20 2343    Sherwood Gambler, MD 11/17/20 [phone]

## 2020-11-17 LAB — URINALYSIS, ROUTINE W REFLEX MICROSCOPIC
Bilirubin Urine: NEGATIVE
Glucose, UA: NEGATIVE mg/dL
Ketones, ur: NEGATIVE mg/dL
Leukocytes,Ua: NEGATIVE
Nitrite: NEGATIVE
Protein, ur: NEGATIVE mg/dL
RBC / HPF: >50 RBC/hpf — ABNORMAL HIGH (ref 0–5)
Specific Gravity, Urine: 1.023 (ref 1.005–1.030)
pH: 5 (ref 5.0–8.0)

## 2020-11-17 LAB — RAPID URINE DRUG SCREEN, HOSP PERFORMED
Amphetamines: NOT DETECTED
Barbiturates: NOT DETECTED
Benzodiazepines: POSITIVE — AB
Cocaine: NOT DETECTED
Opiates: NOT DETECTED
Tetrahydrocannabinol: NOT DETECTED

## 2020-11-17 NOTE — ED Notes (Signed)
Bunny- [phone], pt's mother, called and updated about d/c status. Mother not happy with the fact that her daughter was awoken at 4:30 am to be told she had cancer again without any support there or waiting until she was more awake at 700 am or so. This RN actively listened to pt's mother's concerns and made charge RN and ED provider-Lisa aware. Plan is to place pt in hallway bed until 7 am until a ride can come pick her up. Mother verbalized understanding for that plan going forward. Pt will be updated and moved to Hallway [address].

## 2020-11-17 NOTE — Discharge Instructions (Addendum)
As we discussed, your MRI is concerning for recurrent tumor on your right side. Your team at [facility] has been made aware and should be in contact with you.  They do want you to contact your oncologist and make them aware should they want additional scans.  I have attached copies of your labs/imaging studies on back for physician review if needed. Continue your seizure medications. Return here for any new/acute changes.

## 2020-11-17 NOTE — ED Provider Notes (Addendum)
Assumed care at shift change.  See prior notes for full H&P.  Briefly, 20 y.o. F here with seizures.  Hx of same along with pseudoseizures.  Followed by neuro at [facility].  Currently on vimpat for seizure prevention.  Had seizure PTA and was initially post-ictal, a few observed pseudoseizures here in the ED.  She did have another questionable true seizure episode here in the ED and given ativan.  Has known hx of anaplastic ependymoma s/p resection.    Plan:  MRI pending for further evaluation of questionable findings on head CT at area of prior resection.  If no acute findings, can likely discharge home to follow-up with her OP neurologist.  Results for orders placed or performed during the hospital encounter of 11/16/20  Urinalysis, Routine w reflex microscopic Urine, Clean Catch  Result Value Ref Range   Color, Urine AMBER (A) YELLOW   APPearance CLOUDY (A) CLEAR   Specific Gravity, Urine 1.023 1.005 - 1.030   pH 5.0 5.0 - 8.0   Glucose, UA NEGATIVE NEGATIVE mg/dL   Hgb urine dipstick LARGE (A) NEGATIVE   Bilirubin Urine NEGATIVE NEGATIVE   Ketones, ur NEGATIVE NEGATIVE mg/dL   Protein, ur NEGATIVE NEGATIVE mg/dL   Nitrite NEGATIVE NEGATIVE   Leukocytes,Ua NEGATIVE NEGATIVE   RBC / HPF >50 (H) 0 - 5 RBC/hpf   WBC, UA 0-5 0 - 5 WBC/hpf   Bacteria, UA RARE (A) NONE SEEN   Squamous Epithelial / LPF 0-5 0 - 5   Mucus PRESENT   Urine rapid drug screen (hosp performed)  Result Value Ref Range   Opiates NONE DETECTED NONE DETECTED   Cocaine NONE DETECTED NONE DETECTED   Benzodiazepines POSITIVE (A) NONE DETECTED   Amphetamines NONE DETECTED NONE DETECTED   Tetrahydrocannabinol NONE DETECTED NONE DETECTED   Barbiturates NONE DETECTED NONE DETECTED  Comprehensive metabolic panel  Result Value Ref Range   Sodium 138 135 - 145 mmol/L   Potassium 3.8 3.5 - 5.1 mmol/L   Chloride 106 98 - 111 mmol/L   CO2 24 22 - 32 mmol/L   Glucose, Bld 87 70 - 99 mg/dL   BUN 6 6 - 20 mg/dL   Creatinine, Ser  0.78 0.44 - 1.00 mg/dL   Calcium 9.1 8.9 - 10.3 mg/dL   Total Protein 6.8 6.5 - 8.1 g/dL   Albumin 3.9 3.5 - 5.0 g/dL   AST 18 15 - 41 U/L   ALT 14 0 - 44 U/L   Alkaline Phosphatase 75 38 - 126 U/L   Total Bilirubin 0.5 0.3 - 1.2 mg/dL   GFR, Estimated >60 >60 mL/min   Anion gap 8 5 - 15  CBC with Differential  Result Value Ref Range   WBC 11.6 (H) 4.0 - 10.5 K/uL   RBC 4.49 3.87 - 5.11 MIL/uL   Hemoglobin 13.4 12.0 - 15.0 g/dL   HCT 42.3 36.0 - 46.0 %   MCV 94.2 80.0 - 100.0 fL   MCH 29.8 26.0 - 34.0 pg   MCHC 31.7 30.0 - 36.0 g/dL   RDW 12.6 11.5 - 15.5 %   Platelets 373 150 - 400 K/uL   nRBC 0.0 0.0 - 0.2 %   Neutrophils Relative % 64 %   Neutro Abs 7.5 1.7 - 7.7 K/uL   Lymphocytes Relative 25 %   Lymphs Abs 2.8 0.7 - 4.0 K/uL   Monocytes Relative 8 %   Monocytes Absolute 0.9 0.1 - 1.0 K/uL   Eosinophils Relative 2 %  Eosinophils Absolute 0.3 0.0 - 0.5 K/uL   Basophils Relative 1 %   Basophils Absolute 0.1 0.0 - 0.1 K/uL   Immature Granulocytes 0 %   Abs Immature Granulocytes 0.04 0.00 - 0.07 K/uL  I-Stat beta hCG blood, ED  Result Value Ref Range   I-stat hCG, quantitative <5.0 <5 mIU/mL   Comment 3           CT Head Wo Contrast  Result Date: 11/16/2020 CLINICAL DATA:  Seizure.  History of brain tumor. EXAM: CT HEAD WITHOUT CONTRAST TECHNIQUE: Contiguous axial images were obtained from the base of the skull through the vertex without intravenous contrast. COMPARISON:  None. FINDINGS: Brain: Postsurgical changes of the right parietal lobe. There is a centrally hypodense ring shaped lesion measuring 1 cm at the anterolateral margin of the resection bed. Unchanged calcification in the left parietal lobe. The remainder of the brain is normal. Vascular: No abnormal hyperdensity of the major intracranial arteries or dural venous sinuses. No intracranial atherosclerosis. Skull: Old right parietal craniotomy. Sinuses/Orbits: No fluid levels or advanced mucosal thickening of the  visualized paranasal sinuses. No mastoid or middle ear effusion. The orbits are normal. IMPRESSION: 1. Postsurgical changes of the right parietal lobe with centrally hypodense ring shaped lesion measuring 1 cm at the anterolateral margin of the resection bed, which is indeterminate. MRI of the brain with and without contrast is recommended for further characterization. 2. Unchanged calcification in the left parietal lobe. Electronically Signed   By: Ulyses Jarred M.D.   On: 11/16/2020 20:04   MR Brain W and Wo Contrast  Result Date: 11/16/2020 CLINICAL DATA:  Seizure.  History of brain tumor. EXAM: MRI HEAD WITHOUT AND WITH CONTRAST TECHNIQUE: Multiplanar, multiecho pulse sequences of the brain and surrounding structures were obtained without and with intravenous contrast. CONTRAST:  56m GADAVIST GADOBUTROL 1 MMOL/ML IV SOLN COMPARISON:  None. FINDINGS: Brain: There is new, abnormal contrast enhancement the posterior right parietal resection site that measures approximately 11 x 10 mm (series 12, image 106). No remote enhancing lesion. No acute or chronic hemorrhage. Normal white matter signal, parenchymal volume and CSF spaces. The midline structures are normal. Vascular: Major flow voids are preserved. Skull and upper cervical spine: Remote right parietal craniotomy. Sinuses/Orbits:No paranasal sinus fluid levels or advanced mucosal thickening. No mastoid or middle ear effusion. Normal orbits. IMPRESSION: 1. New abnormal contrast enhancement within the posterior right parietal resection site, measuring 11 x 10 mm, likely recurrent tumor. 2. Otherwise normal brain MRI. Electronically Signed   By: KUlyses JarredM.D.   On: 11/16/2020 23:28    MRI with findings of likely recurrent tumor measuring 11x199m  Will discuss with her team for recommendations.  [facility] they have reviewed power shared images.  Does not recommend any changes to medications at  present.  She has follow-up in brain tumor clinic already scheduled for next week, he will message them to get in touch with her if needing to be seen sooner or recommend changes. She will need to contact her oncologist in the AM should they want additional scans.  4:18 AM Discussed with patient, she acknowledged understanding.  She is comfortable with care plan.  I will give her print outs of her results as well should she need them for physician review.  She understands to return here for any new/acute changes.   SaLarene PickettPA-C 11/17/20 0515    SaLarene PickettPA-C 11/17/20 [phone]  Palumbo, April, MD 11/17/20 [phone]

## 2020-12-11 ENCOUNTER — Inpatient Hospital Stay: Payer: PRIVATE HEALTH INSURANCE

## 2020-12-11 DIAGNOSIS — Z719 Counseling, unspecified: Secondary | ICD-10-CM

## 2020-12-20 ENCOUNTER — Ambulatory Visit: Payer: PRIVATE HEALTH INSURANCE

## 2020-12-21 NOTE — Progress Notes
Brain Tumor Board Summary Note    The Brain Tumor Board comprising of Neuro-Oncologists, Radiation Oncologists, Neurosurgeons, Neuropathologists, and Neuroradiologists met on 12/16/20 and determined that there is nodular enhancement and T2/FLAIR abnormality, but post-radiation treatment effect is favored, as appearance is not convincing for progression of ependymoma.  Recurrent ependymoma typically has a more solid appearance that the current radiographic appearance.    Recommend continued surveillance MRI.  Can proceed with surgical resection in the future if recurrence confirmed.    Patient will follow-up with  [facility] ] Neurosurgery  _0  Neuro-Oncology  _1  Radiation Oncology  _2  Local physicians    This note is written by Candita Borenstein Z. Lazette Estala, MD, PhD as a scribe for the [facility] Brain Tumor Board.

## 2021-02-18 ENCOUNTER — Other Ambulatory Visit: Payer: Self-pay

## 2021-02-18 ENCOUNTER — Emergency Department (HOSPITAL_BASED_OUTPATIENT_CLINIC_OR_DEPARTMENT_OTHER)
Admission: EM | Admit: 2021-02-18 | Discharge: 2021-02-18 | Disposition: A | Discharge status: Home / Self Care | Payer: Medicaid Other | Attending: Emergency Medicine | Admitting: Emergency Medicine

## 2021-02-18 ENCOUNTER — Emergency Department (HOSPITAL_BASED_OUTPATIENT_CLINIC_OR_DEPARTMENT_OTHER): Payer: Medicaid Other

## 2021-02-18 ENCOUNTER — Encounter (HOSPITAL_BASED_OUTPATIENT_CLINIC_OR_DEPARTMENT_OTHER): Payer: Self-pay

## 2021-02-18 DIAGNOSIS — R569 Unspecified convulsions: Secondary | ICD-10-CM | POA: Insufficient documentation

## 2021-02-18 DIAGNOSIS — W228XXA Striking against or struck by other objects, initial encounter: Secondary | ICD-10-CM | POA: Diagnosis not present

## 2021-02-18 DIAGNOSIS — S0990XA Unspecified injury of head, initial encounter: Secondary | ICD-10-CM | POA: Insufficient documentation

## 2021-02-18 DIAGNOSIS — Z85841 Personal history of malignant neoplasm of brain: Secondary | ICD-10-CM | POA: Diagnosis not present

## 2021-02-18 DIAGNOSIS — C801 Malignant (primary) neoplasm, unspecified: Secondary | ICD-10-CM

## 2021-02-18 LAB — CBC WITH DIFFERENTIAL/PLATELET
Abs Immature Granulocytes: 0.13 10*3/uL — ABNORMAL HIGH (ref 0.00–0.07)
Basophils Absolute: 0.1 10*3/uL (ref 0.0–0.1)
Basophils Relative: 1 %
Eosinophils Absolute: 0.6 10*3/uL — ABNORMAL HIGH (ref 0.0–0.5)
Eosinophils Relative: 5 %
HCT: 40.9 % (ref 36.0–46.0)
Hemoglobin: 13.2 g/dL (ref 12.0–15.0)
Immature Granulocytes: 1 %
Lymphocytes Relative: 28 %
Lymphs Abs: 3.6 10*3/uL (ref 0.7–4.0)
MCH: 29.8 pg (ref 26.0–34.0)
MCHC: 32.3 g/dL (ref 30.0–36.0)
MCV: 92.3 fL (ref 80.0–100.0)
Monocytes Absolute: 1.1 10*3/uL — ABNORMAL HIGH (ref 0.1–1.0)
Monocytes Relative: 9 %
Neutro Abs: 7 10*3/uL (ref 1.7–7.7)
Neutrophils Relative %: 56 %
Platelets: 417 10*3/uL — ABNORMAL HIGH (ref 150–400)
RBC: 4.43 MIL/uL (ref 3.87–5.11)
RDW: 13.1 % (ref 11.5–15.5)
WBC: 12.5 10*3/uL — ABNORMAL HIGH (ref 4.0–10.5)
nRBC: 0 % (ref 0.0–0.2)

## 2021-02-18 LAB — BASIC METABOLIC PANEL
Anion gap: 8 (ref 5–15)
BUN: 9 mg/dL (ref 6–20)
CO2: 28 mmol/L (ref 22–32)
Calcium: 10.1 mg/dL (ref 8.9–10.3)
Chloride: 103 mmol/L (ref 98–111)
Creatinine, Ser: 0.64 mg/dL (ref 0.44–1.00)
GFR, Estimated: >60 mL/min (ref >60)
Glucose, Bld: 91 mg/dL (ref 70–99)
Potassium: 3.7 mmol/L (ref 3.5–5.1)
Sodium: 139 mmol/L (ref 135–145)

## 2021-02-18 LAB — HCG, SERUM, QUALITATIVE: Preg, Serum: NEGATIVE

## 2021-02-18 MED ORDER — LACOSAMIDE 200 MG PO TABS: 200.0000 mg | ORAL_TABLET | Freq: Two times a day (BID) | ORAL | 0 refills | Status: DC
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
## 2021-02-18 NOTE — Discharge Instructions (Signed)
No driving for 6 months. 

## 2021-02-18 NOTE — ED Notes (Signed)
Called [facility] transfer line per Dr. Randal Buba to request Dr Vidal Schwalbe Neurologist for call back

## 2021-05-12 ENCOUNTER — Emergency Department (HOSPITAL_COMMUNITY): Payer: Medicaid Other

## 2021-05-12 ENCOUNTER — Emergency Department (HOSPITAL_COMMUNITY)
Admission: EM | Admit: 2021-05-12 | Discharge: 2021-05-12 | Disposition: A | Discharge status: Home / Self Care | Payer: Medicaid Other | Attending: Emergency Medicine | Admitting: Emergency Medicine

## 2021-05-12 DIAGNOSIS — Z79899 Other long term (current) drug therapy: Secondary | ICD-10-CM | POA: Insufficient documentation

## 2021-05-12 DIAGNOSIS — Z85841 Personal history of malignant neoplasm of brain: Secondary | ICD-10-CM | POA: Insufficient documentation

## 2021-05-12 DIAGNOSIS — R569 Unspecified convulsions: Secondary | ICD-10-CM | POA: Insufficient documentation

## 2021-05-12 DIAGNOSIS — R1110 Vomiting, unspecified: Secondary | ICD-10-CM | POA: Insufficient documentation

## 2021-05-12 DIAGNOSIS — R519 Headache, unspecified: Secondary | ICD-10-CM | POA: Insufficient documentation

## 2021-05-12 LAB — CBC WITH DIFFERENTIAL/PLATELET
Abs Immature Granulocytes: 0.03 10*3/uL (ref 0.00–0.07)
Basophils Absolute: 0.1 10*3/uL (ref 0.0–0.1)
Basophils Relative: 1 %
Eosinophils Absolute: 0.3 10*3/uL (ref 0.0–0.5)
Eosinophils Relative: 3 %
HCT: 44.3 % (ref 36.0–46.0)
Hemoglobin: 14.2 g/dL (ref 12.0–15.0)
Immature Granulocytes: 0 %
Lymphocytes Relative: 22 %
Lymphs Abs: 2.2 10*3/uL (ref 0.7–4.0)
MCH: 29.4 pg (ref 26.0–34.0)
MCHC: 32.1 g/dL (ref 30.0–36.0)
MCV: 91.7 fL (ref 80.0–100.0)
Monocytes Absolute: 0.8 10*3/uL (ref 0.1–1.0)
Monocytes Relative: 9 %
Neutro Abs: 6.5 10*3/uL (ref 1.7–7.7)
Neutrophils Relative %: 65 %
Platelets: 398 10*3/uL (ref 150–400)
RBC: 4.83 MIL/uL (ref 3.87–5.11)
RDW: 12 % (ref 11.5–15.5)
WBC: 9.9 10*3/uL (ref 4.0–10.5)
nRBC: 0 % (ref 0.0–0.2)

## 2021-05-12 LAB — COMPREHENSIVE METABOLIC PANEL
ALT: 14 U/L (ref 0–44)
AST: 13 U/L — ABNORMAL LOW (ref 15–41)
Albumin: 4.2 g/dL (ref 3.5–5.0)
Alkaline Phosphatase: 73 U/L (ref 38–126)
Anion gap: 7 (ref 5–15)
BUN: 11 mg/dL (ref 6–20)
CO2: 26 mmol/L (ref 22–32)
Calcium: 9.7 mg/dL (ref 8.9–10.3)
Chloride: 101 mmol/L (ref 98–111)
Creatinine, Ser: 0.65 mg/dL (ref 0.44–1.00)
GFR, Estimated: >60 mL/min (ref >60)
Glucose, Bld: 94 mg/dL (ref 70–99)
Potassium: 4.4 mmol/L (ref 3.5–5.1)
Sodium: 134 mmol/L — ABNORMAL LOW (ref 135–145)
Total Bilirubin: 0.2 mg/dL — ABNORMAL LOW (ref 0.3–1.2)
Total Protein: 7.6 g/dL (ref 6.5–8.1)

## 2021-05-12 MED ORDER — LACTATED RINGERS IV BOLUS
1000.0000 mL | Freq: Once | INTRAVENOUS | Status: AC
  Administered 2021-05-12: 1000 mL via INTRAVENOUS

## 2021-05-12 MED ORDER — LACTATED RINGERS IV SOLN: INTRAVENOUS | Status: DC

## 2021-05-12 MED ORDER — KETOROLAC TROMETHAMINE 30 MG/ML IJ SOLN
30.0000 mg | Freq: Once | INTRAMUSCULAR | Status: AC
  Administered 2021-05-12: 30 mg via INTRAVENOUS
  Filled 2021-05-12: qty 1

## 2021-05-12 MED ORDER — GADOBUTROL 1 MMOL/ML IV SOLN
7.0000 mL | Freq: Once | INTRAVENOUS | Status: AC | PRN
  Administered 2021-05-12: 7 mL via INTRAVENOUS

## 2021-05-12 MED ORDER — METOCLOPRAMIDE HCL 5 MG/ML IJ SOLN
10.0000 mg | Freq: Once | INTRAMUSCULAR | Status: AC
  Administered 2021-05-12: 10 mg via INTRAVENOUS
  Filled 2021-05-12: qty 2

## 2021-05-12 NOTE — ED Triage Notes (Signed)
Pt states she has a history of brain cancer and does normally have seizures. Pt states she had a seizure 2 days ago. Pt also reports vision changes. Pt states she is also stuttering and this is new since the seizure she had 2 days ago. Pt also reports nausea and a headache today.

## 2021-05-12 NOTE — ED Provider Notes (Signed)
[facility] DEPT Provider Note   CSN: [id number] Arrival date & time: 05/12/21  1034     History  Chief Complaint  Patient presents with   Seizures    Madeline [name] is a 21 y.o. female.  21 year old female presents with ataxia and persistent twitching after having a seizure 2 days ago.  Does have a history of brain cancer with most recent surgery at [facility] late last year.  Patient notes that she takes Cymbalta and Vimpat for her seizures and that she has been compliant.  States that she does get seizures weekly.  Notes that she had a head CT done a few months ago which showed possible early hydrocephalus.  Has had emesis x1 as well as mild headache with some visual changes.  Denies any recent history of febrile illness.  Denies any falls      Home Medications Prior to Admission medications   Medication Sig Start Date End Date Taking? Authorizing Provider  ARIPiprazole (ABILIFY) 2 MG tablet Take 2 mg by mouth in the morning. 05/02/20   [provider]  baclofen (LIORESAL) 10 MG tablet Take 10 mg by mouth 3 (three) times daily as needed for muscle spasms. 11/29/19   [provider]  Chlorphen-PE-Acetaminophen 4-10-325 MG TABS Take 1 tablet by mouth every 6 (six) hours as needed. Patient not taking: Reported on 11/16/2020 07/15/20   Chevis Pretty, FNP  cyclobenzaprine (FLEXERIL) 10 MG tablet Take 10 mg by mouth daily as needed for muscle spasms. 07/03/19   [provider]  DULoxetine (CYMBALTA) 30 MG capsule Take 30-60 mg by mouth See admin instructions. Take 60 mg by mouth in the morning and 30 mg at bedtime 11/01/19   [provider]  fluticasone (FLONASE) 50 MCG/ACT nasal spray Place 2 sprays into both nostrils daily. Patient taking differently: Place 2 sprays into both nostrils daily as needed for allergies or rhinitis. 07/15/20   Hassell Done Mary-Margaret, FNP  hydrOXYzine (ATARAX/VISTARIL) 25 MG tablet Take 25  mg by mouth See admin instructions. Take 25 mg by mouth one to two times a day as needed for anxiety    [provider]  lacosamide (VIMPAT) 200 MG TABS tablet Take 1 tablet (200 mg total) by mouth 2 (two) times daily. 02/18/21   Palumbo, April, MD  lacosamide 100 MG TABS Take 1 tablet (100 mg total) by mouth 2 (two) times daily. Patient not taking: Reported on 11/16/2020 01/09/19   [name], MD  memantine (NAMENDA) 10 MG tablet Take 10 mg by mouth in the morning and at bedtime. 11/06/17 12/02/20  [provider]  ondansetron (ZOFRAN) 4 MG tablet Take 4 mg by mouth every 8 (eight) hours as needed for nausea or vomiting.  12/21/17   [provider]  OVER THE COUNTER MEDICATION Take 1 capsule by mouth See admin instructions. Migravent Migraine Prevention Supplement Soft Gels- Take 1 capsule by mouth in the morning and at bedtime    [provider]  VIMPAT 150 MG TABS Take 150 mg by mouth in the morning and at bedtime. 10/10/19   [provider]      Allergies    Ibuprofen, Keppra [levetiracetam], and Penicillins    Review of Systems   Review of Systems  All other systems reviewed and are negative.  Physical Exam Updated Vital Signs BP 119/75    Pulse (!) 59    Temp 98.2 F (36.8 C) (Oral)    Resp 11    Ht 1.626  m (5\' 4" )    Wt 72.6 kg    LMP 04/22/2021    SpO2 98%    BMI 27.46 kg/m  Physical Exam Vitals and nursing note reviewed.  Constitutional:      General: She is not in acute distress.    Appearance: Normal appearance. She is well-developed. She is not toxic-appearing.  HENT:     Head: Normocephalic and atraumatic.  Eyes:     General: Lids are normal.     Conjunctiva/sclera: Conjunctivae normal.     Pupils: Pupils are equal, round, and reactive to light.  Neck:     Thyroid: No thyroid mass.     Trachea: No tracheal deviation.  Cardiovascular:     Rate and Rhythm: Normal rate and regular rhythm.     Heart sounds: Normal heart  sounds. No murmur heard.   No gallop.  Pulmonary:     Effort: Pulmonary effort is normal. No respiratory distress.     Breath sounds: Normal breath sounds. No stridor. No decreased breath sounds, wheezing, rhonchi or rales.  Abdominal:     General: There is no distension.     Palpations: Abdomen is soft.     Tenderness: There is no abdominal tenderness. There is no rebound.  Musculoskeletal:        General: No tenderness. Normal range of motion.     Cervical back: Normal range of motion and neck supple.  Skin:    General: Skin is warm and dry.     Findings: No abrasion or rash.  Neurological:     General: No focal deficit present.     Mental Status: She is alert and oriented to person, place, and time. Mental status is at baseline.     GCS: GCS eye subscore is 4. GCS verbal subscore is 5. GCS motor subscore is 6.     Cranial Nerves: No cranial nerve deficit.     Sensory: No sensory deficit.     Motor: Motor function is intact.     Coordination: Coordination is intact.  Psychiatric:        Attention and Perception: Attention normal.        Speech: Speech normal.        Behavior: Behavior normal.    ED Results / Procedures / Treatments   Labs (all labs ordered are listed, but only abnormal results are displayed) Labs Reviewed - No data to display  EKG None  Radiology No results found.  Procedures Procedures    Medications Ordered in ED Medications - No data to display  ED Course/ Medical Decision Making/ A&P                           Medical Decision Making Amount and/or Complexity of Data Reviewed Labs: ordered. Radiology: ordered.  Risk Prescription drug management.   Patient with history of nonepileptic seizures.  Does have some twitching motions here but yet she is awake and alert throughout these episodes.  I discussed patient's history with her mother as well as her friend at the bedside.  Also reviewed her records from [facility].  Patient admits  to a great deal of stress but would not go into details.  Do not feel that she has any acute psychiatric emergency.  Does have a longstanding history of adjustment disorder.  Patient medicated for migraine here with Reglan and Toradol.  Had MRI of brain along with CT brain and I reviewed the radiology report  and looked at the films and agree with the radiologist that overall her MRI is improved.  She was monitored here and now feels better.  Patient to follow-up with her doctors at [facility] Impression(s) / ED Diagnoses Final diagnoses:  None    Rx / DC Orders ED Discharge Orders     None         Lacretia Leigh, MD 05/12/21 1433

## 2021-05-12 NOTE — ED Notes (Signed)
Pt transported to MRI 

## 2022-01-13 IMAGING — MR MR HEAD WO/W CM
11 of 15 series · 18 of 48 positions shown · IV contrast (gadavist)
Comparison: None.

CLINICAL DATA: Seizure.  History of brain tumor.

EXAM:
MRI HEAD WITHOUT AND WITH CONTRAST
TECHNIQUE: Multiplanar, multiecho pulse sequences of the brain and surrounding
structures were obtained without and with intravenous contrast.
CONTRAST:  7mL GADAVIST GADOBUTROL 1 MMOL/ML IV SOLN

[Series 2: DWI · axial · 3.0mm · 0.94mm/px · z∈[-72,+65]mm · 3 of 95 slices shown (1 of 2)]
[im 1/95]
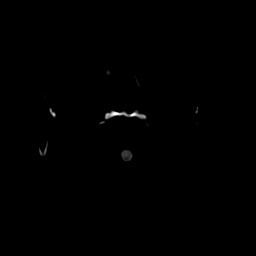
[im 48/95]
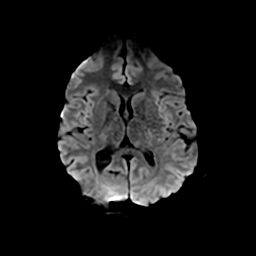
[im 95/95]
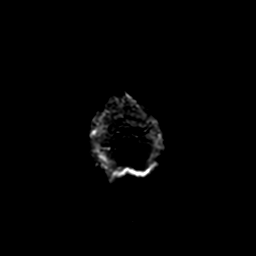

[Series 3: DWI · coronal · 4.0mm · 0.94mm/px · 3 of 67 slices shown (2 of 2)]
[im 1/67]
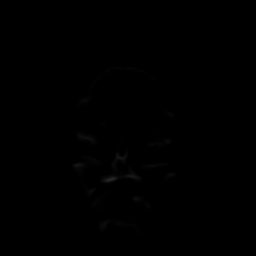
[im 34/67]
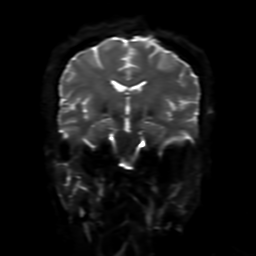
[im 67/67]
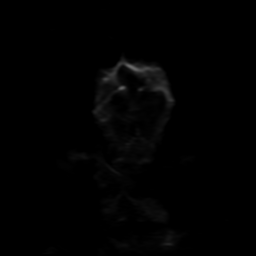

[Series 4: FLAIR · sagittal · 5.0mm · 0.23mm/px · 1 of 25 slices shown (1 of 3)]
[im 1/25]
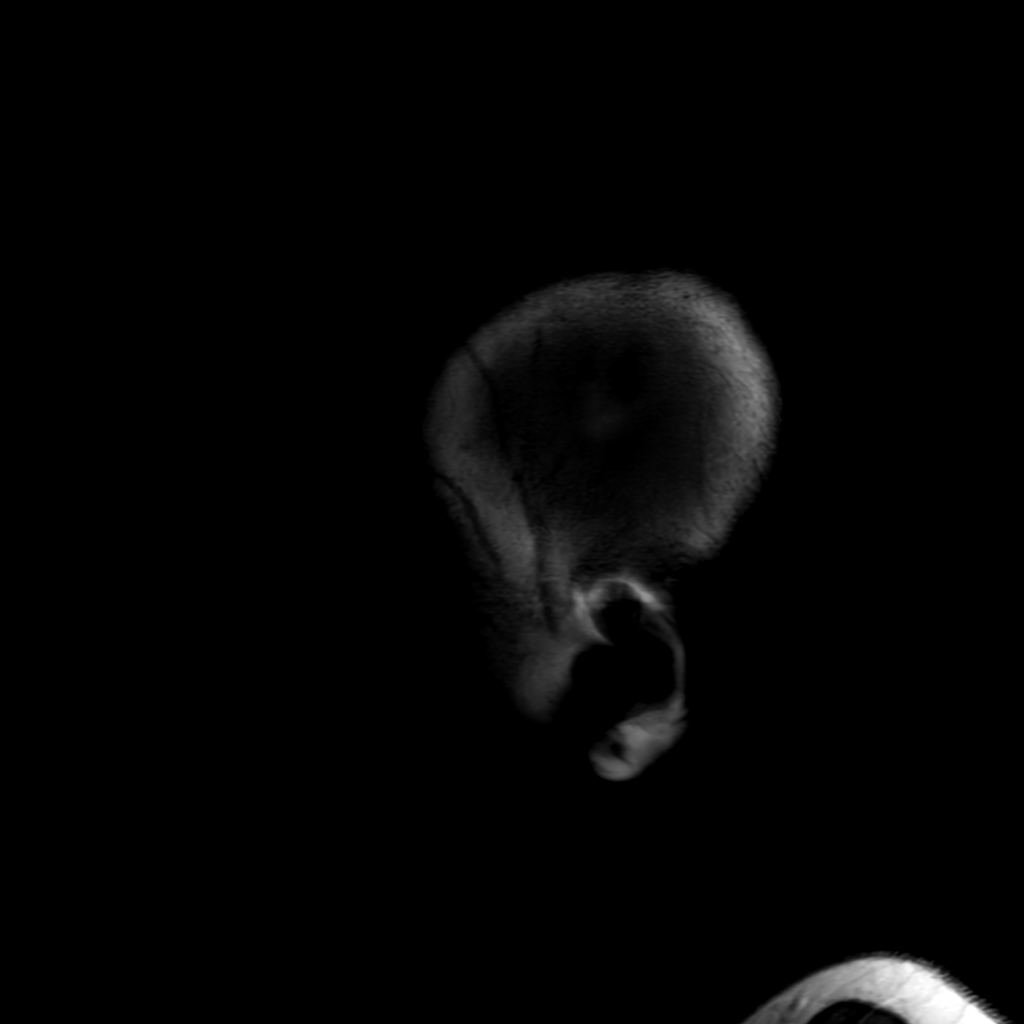

[Series 5: T2 · axial · 5.0mm · 0.23mm/px · 1 of 26 slices shown (1 of 2)]
[im 1/26]
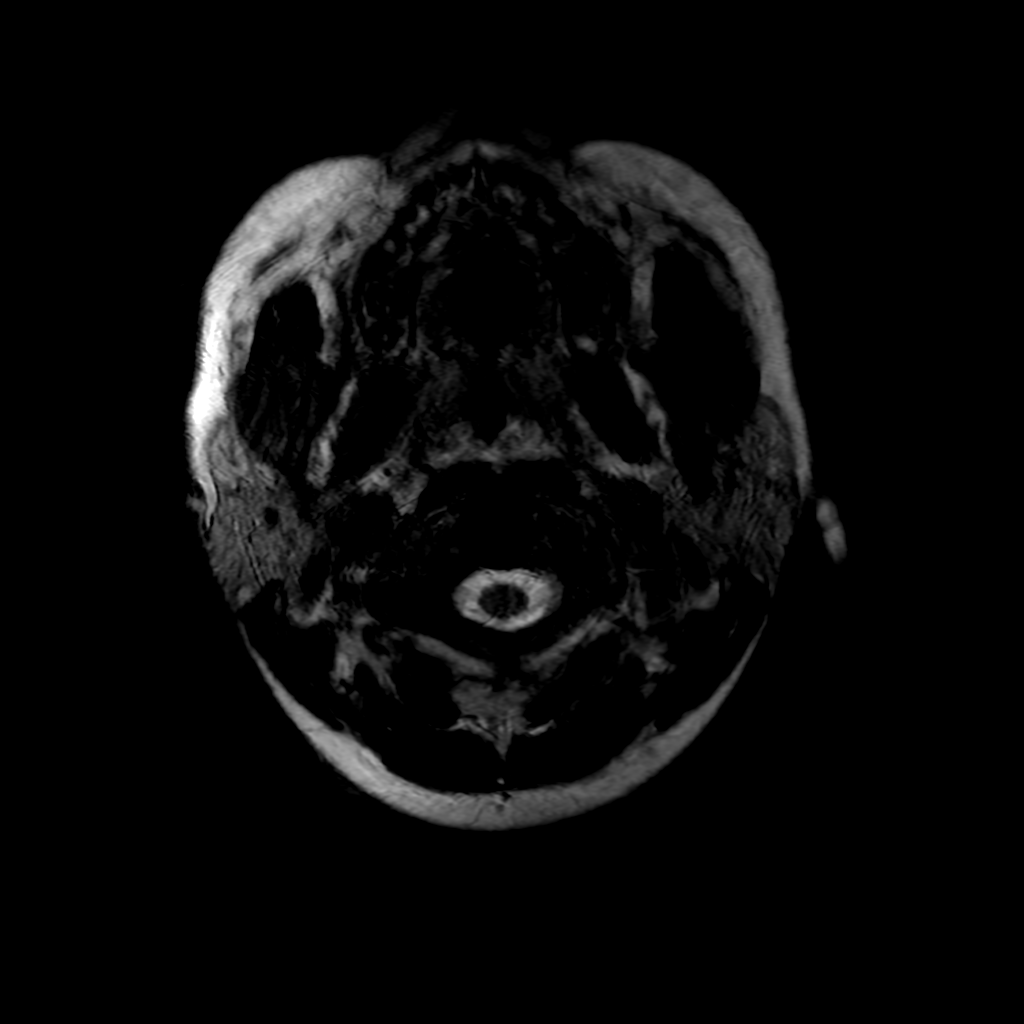

[Series 6: FLAIR · axial · 4.0mm · 0.45mm/px · z∈[-80,+67]mm · 2 of 35 slices shown (2 of 3)]
[im 1/35]
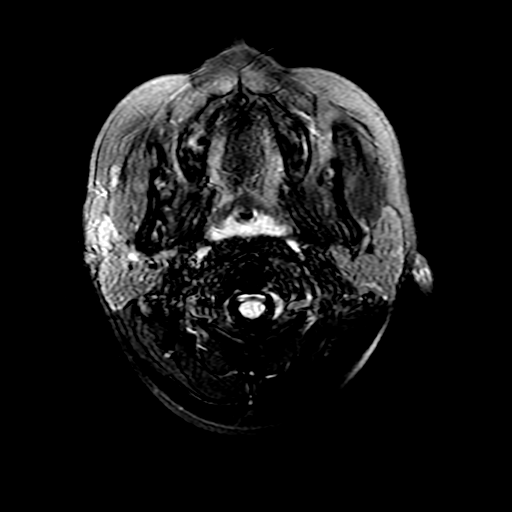
[im 35/35]
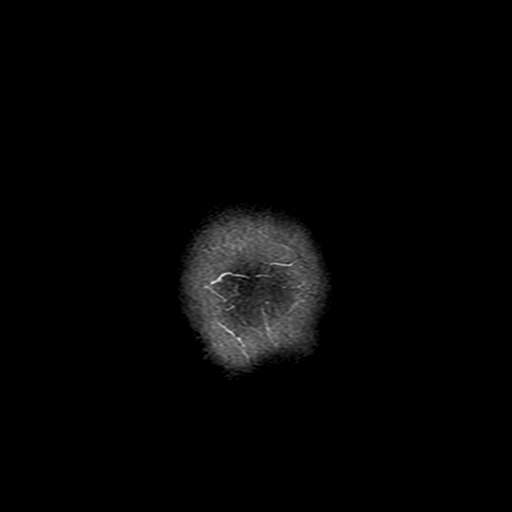

[Series 9: T2 · coronal · 3.0mm · 0.35mm/px · 1 of 25 slices shown (2 of 2)]
[im 1/25]
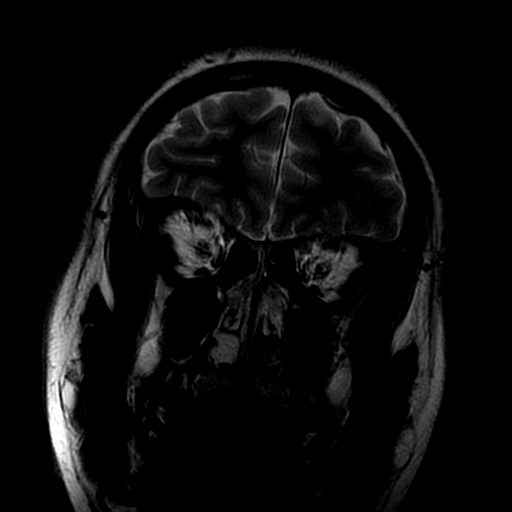

[Series 10: FLAIR · coronal · 3.0mm · 0.39mm/px · 1 of 25 slices shown (3 of 3)]
[im 1/25]
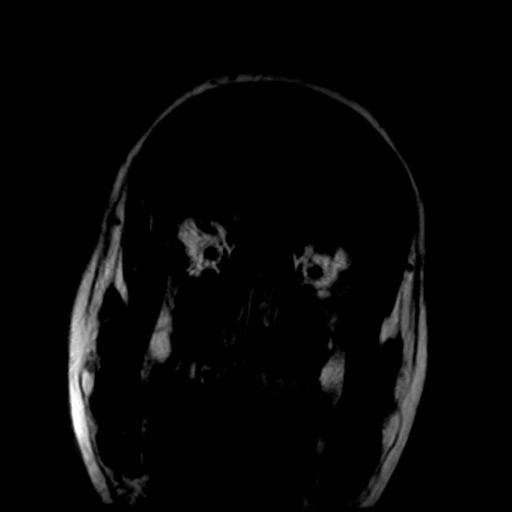

[Series 11: T2 post-contrast · coronal · 5.0mm · 0.20mm/px · 1 of 28 slices shown]
[im 1/28]
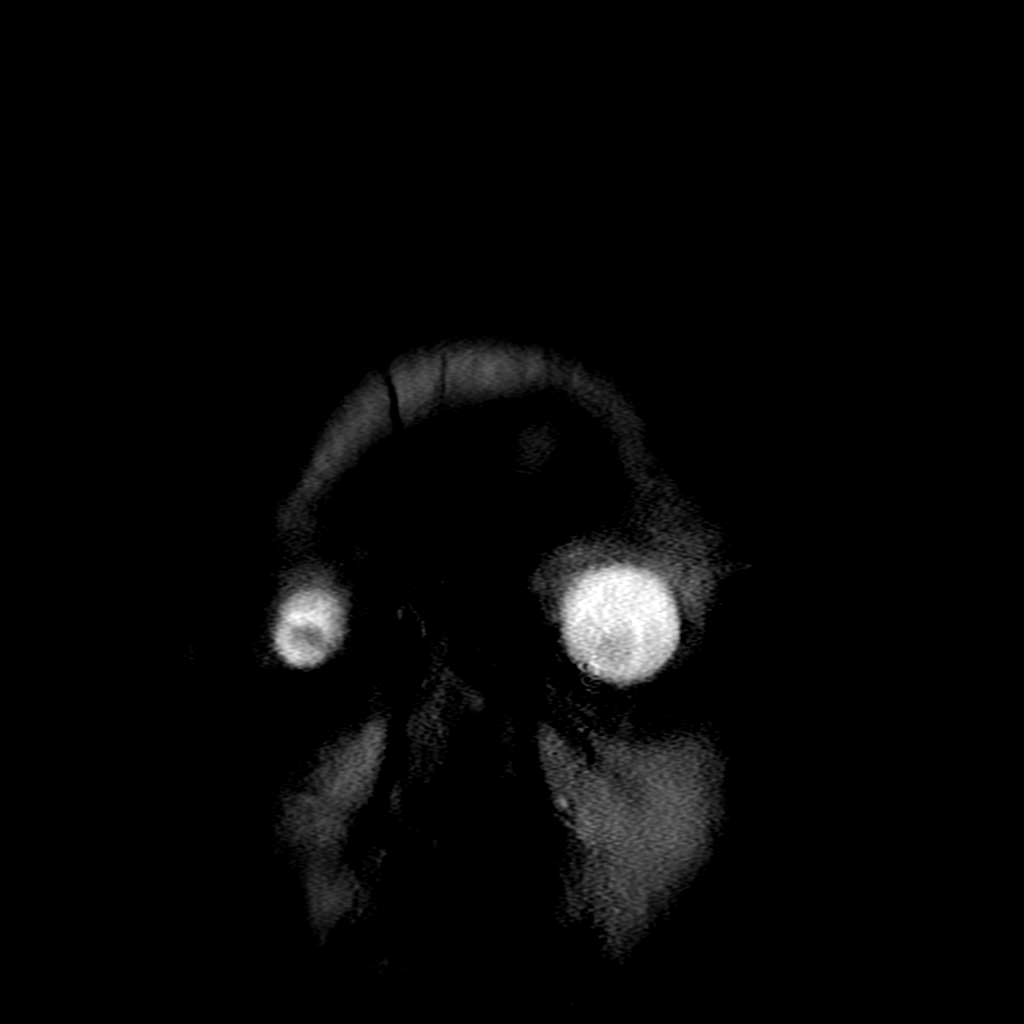

[Series 13: FLAIR post-contrast · sagittal · 5.0mm · 0.47mm/px · 1 of 25 slices shown]
[im 1/25]
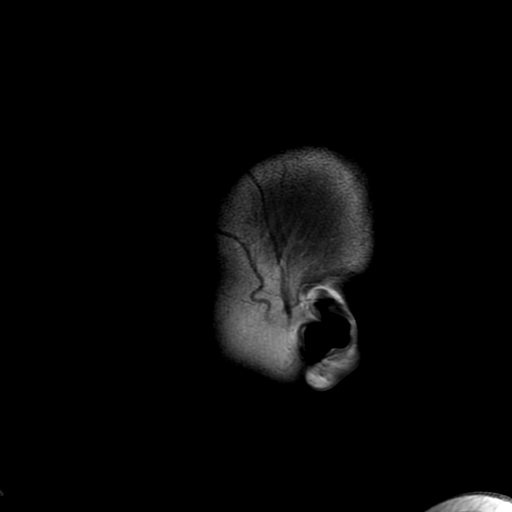

[Series 250: ADC · axial · 3.0mm · 0.94mm/px · z∈[-72,+65]mm · 2 of 48 slices shown (1 of 2)]
[im 1/48]
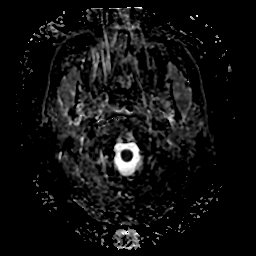
[im 48/48]
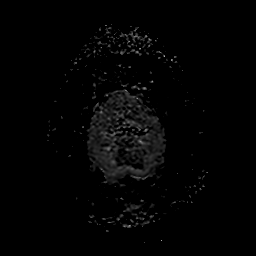

[Series 350: ADC · coronal · 4.0mm · 0.94mm/px · 2 of 33 slices shown (2 of 2)]
[im 1/33]
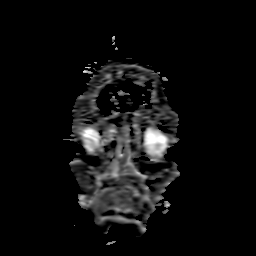
[im 33/33]
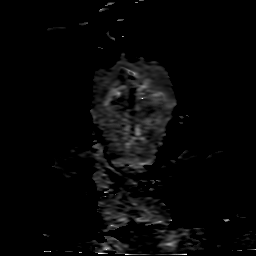

[18 of 48 positions shown; findings below may reference images not displayed]

FINDINGS: Brain: There is new, abnormal contrast enhancement the posterior
right parietal resection site that measures approximately 11 x 10 mm
(series 12, image 106). No remote enhancing lesion. No acute or
chronic hemorrhage. Normal white matter signal, parenchymal volume
and CSF spaces. The midline structures are normal.

Vascular: Major flow voids are preserved.

Skull and upper cervical spine: Remote right parietal craniotomy.

Sinuses/Orbits:No paranasal sinus fluid levels or advanced mucosal
thickening. No mastoid or middle ear effusion. Normal orbits.
IMPRESSION: 1. New abnormal contrast enhancement within the posterior right
parietal resection site, measuring 11 x 10 mm, likely recurrent
tumor.
2. Otherwise normal brain MRI.
